# Patient Record
Sex: Female | Born: 1980 | Race: White | Hispanic: No | Marital: Single | Smoking: Former smoker
Health system: Southern US, Community
[De-identification: ages and names within clinical notes are randomized; demographics above are authoritative.]

## PROBLEM LIST (undated history)

## (undated) DIAGNOSIS — G8929 Other chronic pain: Secondary | ICD-10-CM

## (undated) DIAGNOSIS — K529 Noninfective gastroenteritis and colitis, unspecified: Secondary | ICD-10-CM

## (undated) DIAGNOSIS — K2970 Gastritis, unspecified, without bleeding: Secondary | ICD-10-CM

## (undated) DIAGNOSIS — F419 Anxiety disorder, unspecified: Secondary | ICD-10-CM

## (undated) DIAGNOSIS — K449 Diaphragmatic hernia without obstruction or gangrene: Secondary | ICD-10-CM

## (undated) DIAGNOSIS — J449 Chronic obstructive pulmonary disease, unspecified: Secondary | ICD-10-CM

## (undated) HISTORY — PX: CYSTECTOMY: SUR359

## (undated) HISTORY — DX: Other chronic pain: G89.29

## (undated) HISTORY — DX: Anxiety disorder, unspecified: F41.9

---

## 2020-08-27 NOTE — L&D Delivery Note (Signed)
Operative Delivery Note At 4:49 AM a viable female was delivered via Vaginal, [employer], operational).  Presentation: vertex; Position: Right,, Occiput,, Anterior; Station: 0.   FHT noted to be 90bpm.  Pt repositioned, FSE attempted; however not tracing due to cable issues.  FHT re-established with external monitoring.    Verbal consent: obtained from patient.  Risks and benefits discussed in detail.  Risks include, but are not limited to the risks of anesthesia, bleeding, infection, damage to maternal tissues, fetal cephalhematoma.  There is also the risk of inability to effect vaginal delivery of the head, or shoulder dystocia that cannot be resolved by established maneuvers, leading to the need for emergency cesarean section.  Bell applied, one pull with considerable descent followed by delivery.  Cord clamped, cut and handed off to awaiting pediatricians.  2nd degree laceration repaired as outlined below by  Wynelle Bourgeois, CNM.  APGAR: 9, 9; weight: pending   Placenta status: to L&D Cord:  with the following complications: .  Cord pH: n/a  Anesthesia:  none Instruments: Bell Episiotomy: None Lacerations: Perineal;2nd degree Suture Repair: 3-0 vicryl Est. Blood Loss (mL):  300cc  Mom to postpartum.  Baby to Couplet care / Skin to Skin.  Alessandra Bevels Akira Perusse 07/15/2021, 5:10 AM

## 2020-09-10 ENCOUNTER — Emergency Department (HOSPITAL_COMMUNITY): Payer: 59

## 2020-09-10 ENCOUNTER — Other Ambulatory Visit: Payer: Self-pay

## 2020-09-10 ENCOUNTER — Emergency Department (HOSPITAL_COMMUNITY)
Admission: EM | Admit: 2020-09-10 | Discharge: 2020-09-10 | Disposition: A | Discharge status: Home / Self Care | Payer: 59 | Attending: Emergency Medicine | Admitting: Emergency Medicine

## 2020-09-10 DIAGNOSIS — O200 Threatened abortion: Secondary | ICD-10-CM | POA: Insufficient documentation

## 2020-09-10 DIAGNOSIS — O26891 Other specified pregnancy related conditions, first trimester: Secondary | ICD-10-CM | POA: Diagnosis present

## 2020-09-10 DIAGNOSIS — Z3A01 Less than 8 weeks gestation of pregnancy: Secondary | ICD-10-CM | POA: Insufficient documentation

## 2020-09-10 DIAGNOSIS — R58 Hemorrhage, not elsewhere classified: Secondary | ICD-10-CM

## 2020-09-10 DIAGNOSIS — Z87891 Personal history of nicotine dependence: Secondary | ICD-10-CM | POA: Insufficient documentation

## 2020-09-10 DIAGNOSIS — J449 Chronic obstructive pulmonary disease, unspecified: Secondary | ICD-10-CM | POA: Insufficient documentation

## 2020-09-10 HISTORY — DX: Diaphragmatic hernia without obstruction or gangrene: K44.9

## 2020-09-10 HISTORY — DX: Chronic obstructive pulmonary disease, unspecified: J44.9

## 2020-09-10 HISTORY — DX: Noninfective gastroenteritis and colitis, unspecified: K52.9

## 2020-09-10 HISTORY — DX: Gastritis, unspecified, without bleeding: K29.70

## 2020-09-10 LAB — CBC WITH DIFFERENTIAL/PLATELET
Abs Immature Granulocytes: 0.02 10*3/uL (ref 0.00–0.07)
Basophils Absolute: 0.1 10*3/uL (ref 0.0–0.1)
Basophils Relative: 1 %
Eosinophils Absolute: 0.1 10*3/uL (ref 0.0–0.5)
Eosinophils Relative: 1 %
HCT: 42.3 % (ref 36.0–46.0)
Hemoglobin: 14.3 g/dL (ref 12.0–15.0)
Immature Granulocytes: 0 %
Lymphocytes Relative: 29 %
Lymphs Abs: 2.5 10*3/uL (ref 0.7–4.0)
MCH: 30.4 pg (ref 26.0–34.0)
MCHC: 33.8 g/dL (ref 30.0–36.0)
MCV: 90 fL (ref 80.0–100.0)
Monocytes Absolute: 0.8 10*3/uL (ref 0.1–1.0)
Monocytes Relative: 10 %
Neutro Abs: 4.9 10*3/uL (ref 1.7–7.7)
Neutrophils Relative %: 59 %
Platelets: 383 10*3/uL (ref 150–400)
RBC: 4.7 MIL/uL (ref 3.87–5.11)
RDW: 12.1 % (ref 11.5–15.5)
WBC: 8.4 10*3/uL (ref 4.0–10.5)
nRBC: 0 % (ref 0.0–0.2)

## 2020-09-10 LAB — COMPREHENSIVE METABOLIC PANEL
ALT: 20 U/L (ref 0–44)
AST: 17 U/L (ref 15–41)
Albumin: 4.4 g/dL (ref 3.5–5.0)
Alkaline Phosphatase: 43 U/L (ref 38–126)
Anion gap: 8 (ref 5–15)
BUN: 7 mg/dL (ref 6–20)
CO2: 23 mmol/L (ref 22–32)
Calcium: 9.2 mg/dL (ref 8.9–10.3)
Chloride: 104 mmol/L (ref 98–111)
Creatinine, Ser: 0.62 mg/dL (ref 0.44–1.00)
GFR, Estimated: >60 mL/min (ref >60)
Glucose, Bld: 103 mg/dL — ABNORMAL HIGH (ref 70–99)
Potassium: 3.4 mmol/L — ABNORMAL LOW (ref 3.5–5.1)
Sodium: 135 mmol/L (ref 135–145)
Total Bilirubin: 0.4 mg/dL (ref 0.3–1.2)
Total Protein: 8.1 g/dL (ref 6.5–8.1)

## 2020-09-10 LAB — PREGNANCY, URINE: Preg Test, Ur: POSITIVE — AB

## 2020-09-10 LAB — HCG, QUANTITATIVE, PREGNANCY: hCG, Beta Chain, Quant, S: 202 m[IU]/mL — ABNORMAL HIGH (ref <5)

## 2020-09-10 LAB — WET PREP, GENITAL
Clue Cells Wet Prep HPF POC: NONE SEEN
Sperm: NONE SEEN
Trich, Wet Prep: NONE SEEN
Yeast Wet Prep HPF POC: NONE SEEN

## 2020-09-10 LAB — ABO/RH: ABO/RH(D): O POS

## 2020-09-10 NOTE — Discharge Instructions (Signed)
You were seen in the emergency department today with vaginal bleeding in early pregnancy.  Your pregnancy hormone levels are very low and we were not able to see a pregnancy in your uterus.  This could be because you are very early in pregnancy but could also be from an ongoing miscarriage.  We would like for you to return to the emergency department in 2 days (Monday) for repeat pregnancy hormone levels.

## 2020-09-10 NOTE — ED Provider Notes (Signed)
Emergency Department Provider Note   I have reviewed the triage vital signs and the nursing notes.   HISTORY  Chief Complaint No chief complaint on file.   HPI Darlene Stanley is a 40 y.o. female [phone] currently [redacted] wks pregnant by dates with LMP of 07/29/21 presents to the ED with cramping lower abdominal pain and vaginal bleeding. Bleeding began as spotting and then has progressed to heavier bleeding and passage of clot. Patient denies any lightheadedness or fatigue. No dysuria, hesitancy, or urgency. No fever. No severe back pain. She has not seen an OB yet during this pregnancy. No radiation of symptoms or modifying factors.   Past Medical History:  Diagnosis Date  . Colitis   . COPD (chronic obstructive pulmonary disease) (HCC)   . Gastritis   . Hiatal hernia     There are no problems to display for this patient.   Past Surgical History:  Procedure Laterality Date  . CYSTECTOMY     back    Allergies Patient has no allergy information on record.  History reviewed. No pertinent family history.  Social History Social History   Tobacco Use  . Smoking status: Former Smoker    Quit date: 09/10/2017    Years since quitting: 3.0  . Smokeless tobacco: Never Used  Vaping Use  . Vaping Use: Former  . Quit date: 08/22/2020  . Substances: Nicotine, Flavoring  Substance Use Topics  . Alcohol use: Not Currently  . Drug use: Not Currently    Review of Systems  Constitutional: No fever/chills Eyes: No visual changes. ENT: No sore throat. Cardiovascular: Denies chest pain. Respiratory: Denies shortness of breath. Gastrointestinal: Positive lower abdominal pain.  No nausea, no vomiting.  No diarrhea.  No constipation. Genitourinary: Negative for dysuria. Positive vaginal bleeding.  Musculoskeletal: Negative for back pain. Skin: Negative for rash. Neurological: Negative for headaches, focal weakness or numbness.  10-point ROS otherwise  negative.  ____________________________________________   PHYSICAL EXAM:  VITAL SIGNS: ED Triage Vitals  Enc Vitals Group     BP 09/10/20 1616 (!) 122/98     Pulse Rate 09/10/20 1616 90     Resp 09/10/20 1616 16     Temp 09/10/20 1616 98.1 F (36.7 C)     Temp Source 09/10/20 1616 Oral     SpO2 09/10/20 1616 99 %     Weight 09/10/20 1617 204 lb (92.5 kg)     Height 09/10/20 1617 5\' 5"  (1.651 m)   Constitutional: Alert and oriented. Well appearing and in no acute distress. Eyes: Conjunctivae are normal.  Head: Atraumatic. Nose: No congestion/rhinnorhea. Mouth/Throat: Mucous membranes are moist.   Neck: No stridor.   Cardiovascular: Normal rate, regular rhythm. Good peripheral circulation. Grossly normal heart sounds.   Respiratory: Normal respiratory effort.  No retractions. Lungs CTAB. Gastrointestinal: Soft and nontender. No distention.  Genitourinary: Exam performed after obtaining patient's verbal consent and with nurse chaperone. There is clot in the vaginal vault with red blood but no brisk bleeding. Musculoskeletal: No lower extremity tenderness nor edema. No gross deformities of extremities. Neurologic:  Normal speech and language. No gross focal neurologic deficits are appreciated.  Skin:  Skin is warm, dry and intact. No rash noted.   ____________________________________________   LABS (all labs ordered are listed, but only abnormal results are displayed)  Labs Reviewed  WET PREP, GENITAL - Abnormal; Notable for the following components:      Result Value   WBC, Wet Prep HPF POC FEW (*)  All other components within normal limits  COMPREHENSIVE METABOLIC PANEL - Abnormal; Notable for the following components:   Potassium 3.4 (*)    Glucose, Bld 103 (*)    All other components within normal limits  HCG, QUANTITATIVE, PREGNANCY - Abnormal; Notable for the following components:   hCG, Beta Chain, Quant, S 202 (*)    All other components within normal limits   PREGNANCY, URINE - Abnormal; Notable for the following components:   Preg Test, Ur POSITIVE (*)    All other components within normal limits  CBC WITH DIFFERENTIAL/PLATELET  ABO/RH  GC/CHLAMYDIA PROBE AMP ([place]) NOT AT [facility]  WET PREP  (BD AFFIRM) ([place])   ____________________________________________  RADIOLOGY  US OB LESS THAN 14 WEEKS WITH OB TRANSVAGINAL  Result Date: 09/10/2020 CLINICAL DATA:  Heavy bleeding, last menstrual period 07/29/2020, gestational age by last menstrual period of 6 weeks and 1 day with estimated due date of 05/05/2021. Quantitative beta hCG pending. EXAM: OBSTETRIC <14 WK [place] AND TRANSVAGINAL OB US TECHNIQUE: Both transabdominal and transvaginal ultrasound examinations were performed for complete evaluation of the gestation as well as the maternal uterus, adnexal regions, and pelvic cul-de-sac. Transvaginal technique was performed to assess early pregnancy. COMPARISON:  None. FINDINGS: Intrauterine gestational sac: None Yolk sac:  Not Visualized. Embryo:  Not Visualized. Cardiac Activity: Not Visualized. Subchorionic hemorrhage:  None visualized. Maternal uterus/adnexae: Bilateral ovaries are not visualized. The uterus is grossly unremarkable. IMPRESSION: 1. No gestational sac, yolk sac, fetal pole, or cardiac activity visualized. An early pregnancy or ectopic pregnancy cannot be excluded. Recommend follow-up quantitative B-HCG levels and follow-up US in 14 days to assess viability. This recommendation follows SRU consensus guidelines: Diagnostic Criteria for Nonviable Pregnancy Early in the First Trimester. Malva Limes Med 2013; 401:0272-53. 2. Bilateral ovaries are not visualized. Electronically Signed   By: Tish Frederickson M.D.   On: 09/10/2020 17:52    ____________________________________________   PROCEDURES  Procedure(s) performed:   Procedures  None  ____________________________________________   INITIAL IMPRESSION / ASSESSMENT AND PLAN / ED  COURSE  Pertinent labs & imaging results that were available during my care of the patient were reviewed by me and considered in my medical decision making (see chart for details).   Patient presents to the ED with lower abdominal pain and vaginal bleeding currently [redacted] wks pregnant by date. HDS here. Plan for labs, quant hCG, and TVUS to evaluate for IUP or other secondary sign to suspect ectopic vs threatened abortion.   Patient's beta hCG is only 202.  She is O+ and so not requiring RhoGAM.  Ultrasound unable to identify IUP.  No free fluid in the abdomen.  Suspect miscarriage clinically but after speaking with Dr. Macon Large with OB/Gyn she will need a repeat beta hCG on Monday.  Unfortunately, due to weather the clinic will be closed on Monday and so patient will need to return to the emergency department to have this test drawn unless she can find an OB or PCP to do this for her on Monday.  Patient understands that this clinically seems like a miscarriage but that ectopic pregnancy will need to be fully eliminated as a possibility with second blood test.  ____________________________________________  FINAL CLINICAL IMPRESSION(S) / ED DIAGNOSES  Final diagnoses:  Threatened miscarriage in early pregnancy    Note:  This document was prepared using Dragon voice recognition software and may include unintentional dictation errors.  Alona Bene, MD, [facility] Emergency Medicine    Gerren Hoffmeier, Arlyss Repress,  MD 09/11/20 1531

## 2020-09-10 NOTE — ED Triage Notes (Signed)
Pt to the ED with c/o vaginal bleeding during pregnancy. Pt states she is [redacted] wks pregnant and started bleeding today around noon.  Pt states she is soaking a pad an hour and it is bright red in color. The pt has not had her first OB appt yet.

## 2020-09-10 NOTE — ED Notes (Signed)
U/s at bedside

## 2020-09-12 LAB — GC/CHLAMYDIA PROBE AMP (~~LOC~~) NOT AT ARMC
Chlamydia: NEGATIVE
Comment: NEGATIVE
Comment: NORMAL
Neisseria Gonorrhea: NEGATIVE

## 2020-09-19 ENCOUNTER — Other Ambulatory Visit: Payer: Self-pay | Admitting: Obstetrics & Gynecology

## 2020-09-19 DIAGNOSIS — O3680X0 Pregnancy with inconclusive fetal viability, not applicable or unspecified: Secondary | ICD-10-CM

## 2020-09-20 ENCOUNTER — Other Ambulatory Visit: Payer: 59

## 2020-12-02 ENCOUNTER — Ambulatory Visit: Payer: 59 | Admitting: Advanced Practice Midwife

## 2020-12-02 ENCOUNTER — Other Ambulatory Visit: Payer: Self-pay

## 2020-12-02 ENCOUNTER — Encounter: Payer: Self-pay | Admitting: Advanced Practice Midwife

## 2020-12-02 VITALS — BP 125/88 | HR 91 | Ht 65.0 in | Wt 238.5 lb

## 2020-12-02 DIAGNOSIS — O09521 Supervision of elderly multigravida, first trimester: Secondary | ICD-10-CM | POA: Diagnosis not present

## 2020-12-02 DIAGNOSIS — Z3201 Encounter for pregnancy test, result positive: Secondary | ICD-10-CM | POA: Diagnosis not present

## 2020-12-02 DIAGNOSIS — O09529 Supervision of elderly multigravida, unspecified trimester: Secondary | ICD-10-CM | POA: Insufficient documentation

## 2020-12-02 DIAGNOSIS — O3680X0 Pregnancy with inconclusive fetal viability, not applicable or unspecified: Secondary | ICD-10-CM | POA: Diagnosis not present

## 2020-12-02 DIAGNOSIS — O09299 Supervision of pregnancy with other poor reproductive or obstetric history, unspecified trimester: Secondary | ICD-10-CM | POA: Diagnosis not present

## 2020-12-02 LAB — POCT URINE PREGNANCY: Preg Test, Ur: POSITIVE — AB

## 2020-12-02 NOTE — Progress Notes (Signed)
   GYN VISIT Patient name: Darlene Stanley MRN [id number]  Date of birth: [birth year] Chief Complaint:   Possible Pregnancy (Has had 2+ UPT @ home)  History of Present Illness:   Darlene Stanley is a 40 y.o. [phone] Caucasian female being seen today for confirmation of pregnancy. Denies pain, nausea or spotting. Has had vag del x 2, most recent 16 years ago. Is taking PNV and Beano for colitis. Sure of LMP with reg cycles.  Depression screen PHQ 2/9 12/02/2020  Decreased Interest 1  Down, Depressed, Hopeless 1  PHQ - 2 Score 2  Altered sleeping 2  Tired, decreased energy 3  Change in appetite 1  Feeling bad or failure about yourself  2  Trouble concentrating 1  Moving slowly or fidgety/restless 0  Suicidal thoughts 0  PHQ-9 Score 11    Patient's last menstrual period was 10/15/2020.  Review of Systems:   Pertinent items are noted in HPI Denies fever/chills, dizziness, headaches, visual disturbances, fatigue, shortness of breath, chest pain, abdominal pain, vomiting, abnormal vaginal discharge/itching/odor/irritation, problems with periods, bowel movements, urination, or intercourse unless otherwise stated above.  Pertinent History Reviewed:  Reviewed past medical,surgical, social, obstetrical and family history.  Reviewed problem list, medications and allergies. Physical Assessment:   Vitals:   12/02/20 1045  BP: 125/88  Pulse: 91  Weight: 238 lb 8 oz (108.2 kg)  Height: 5\' 5"  (1.651 m)  Body mass index is 39.69 kg/m.       Physical Examination:   General appearance: alert, well appearing, and in no distress  Mental status: alert, oriented to person, place, and time  Skin: warm & dry   Cardiovascular: normal heart rate noted  Respiratory: normal respiratory effort, no distress  Abdomen: soft, non-tender   Pelvic: examination not indicated  Extremities: no edema    Results for orders placed or performed in visit on 12/02/20 (from the past 24 hour(s))  POCT urine  pregnancy   Collection Time: 12/02/20 10:55 AM  Result Value Ref Range   Preg Test, Ur Positive (A) Negative    Assessment & Plan:  1) Early preg> will get dating scan in 2-3wks  2) AMA> start bASA 162mg  daily at 12wks; discussed BPPs at 36wks with IOL 39-40wks  Meds: No orders of the defined types were placed in this encounter.   Orders Placed This Encounter  Procedures  . 02/01/21 OB Comp Less 14 Wks  . POCT urine pregnancy    Return in about 2 weeks (around 12/16/2020) for Dating u/s.  [place] CNM 12/02/2020 11:17 AM

## 2020-12-02 NOTE — Patient Instructions (Signed)
https://www.cdc.gov/pregnancy/infections.html">  First Trimester of Pregnancy  The first trimester of pregnancy starts on the first day of your last menstrual period until the end of week 12. This is also called months 1 through 3 of pregnancy. Body changes during your first trimester Your body goes through many changes during pregnancy. The changes usually return to normal after your baby is born. Physical changes  You may gain or lose weight.  Your breasts may grow larger and hurt. The area around your nipples may get darker.  Dark spots or blotches may develop on your face.  You may have changes in your hair. Health changes  You may feel like you might vomit (nauseous), and you may vomit.  You may have heartburn.  You may have headaches.  You may have trouble pooping (constipation).  Your gums may bleed. Other changes  You may get tired easily.  You may pee (urinate) more often.  Your menstrual periods will stop.  You may not feel hungry.  You may want to eat certain kinds of food.  You may have changes in your emotions from day to day.  You may have more dreams. Follow these instructions at home: Medicines  Take over-the-counter and prescription medicines only as told by your doctor. Some medicines are not safe during pregnancy.  Take a prenatal vitamin that contains at least 600 micrograms (mcg) of folic acid. Eating and drinking  Eat healthy meals that include: ? Fresh fruits and vegetables. ? Whole grains. ? Good sources of protein, such as meat, eggs, or tofu. ? Low-fat dairy products.  Avoid raw meat and unpasteurized juice, milk, and cheese.  If you feel like you may vomit, or you vomit: ? Eat 4 or 5 small meals a day instead of 3 large meals. ? Try eating a few soda crackers. ? Drink liquids between meals instead of during meals.  You may need to take these actions to prevent or treat trouble pooping: ? Drink enough fluids to keep your pee  (urine) pale yellow. ? Eat foods that are high in fiber. These include beans, whole grains, and fresh fruits and vegetables. ? Limit foods that are high in fat and sugar. These include fried or sweet foods. Activity  Exercise only as told by your doctor. Most people can do their usual exercise routine during pregnancy.  Stop exercising if you have cramps or pain in your lower belly (abdomen) or low back.  Do not exercise if it is too hot or too humid, or if you are in a place of great height (high altitude).  Avoid heavy lifting.  If you choose to, you may have sex unless your doctor tells you not to. Relieving pain and discomfort  Wear a good support bra if your breasts are sore.  Rest with your legs raised (elevated) if you have leg cramps or low back pain.  If you have bulging veins (varicose veins) in your legs: ? Wear support hose as told by your doctor. ? Raise your feet for 15 minutes, 3-4 times a day. ? Limit salt in your food. Safety  Wear your seat belt at all times when you are in a car.  Talk with your doctor if someone is hurting you or yelling at you.  Talk with your doctor if you are feeling sad or have thoughts of hurting yourself. Lifestyle  Do not use hot tubs, steam rooms, or saunas.  Do not douche. Do not use tampons or scented sanitary pads.  Do not   use herbal medicines, illegal drugs, or medicines that are not approved by your doctor. Do not drink alcohol.  Do not smoke or use any products that contain nicotine or tobacco. If you need help quitting, ask your doctor.  Avoid cat litter boxes and soil that is used by cats. These carry germs that can cause harm to the baby and can cause a loss of your baby by miscarriage or stillbirth. General instructions  Keep all follow-up visits. This is important.  Ask for help if you need counseling or if you need help with nutrition. Your doctor can give you advice or tell you where to go for help.  Visit your  dentist. At home, brush your teeth with a soft toothbrush. Floss gently.  Write down your questions. Take them to your prenatal visits. Where to find more information  American Pregnancy Association: americanpregnancy.org  American College of Obstetricians and Gynecologists: www.acog.org  Office on Women's Health: womenshealth.gov/pregnancy Contact a doctor if:  You are dizzy.  You have a fever.  You have mild cramps or pressure in your lower belly.  You have a nagging pain in your belly area.  You continue to feel like you may vomit, you vomit, or you have watery poop (diarrhea) for 24 hours or longer.  You have a bad-smelling fluid coming from your vagina.  You have pain when you pee.  You are exposed to a disease that spreads from person to person, such as chickenpox, measles, Zika virus, HIV, or hepatitis. Get help right away if:  You have spotting or bleeding from your vagina.  You have very bad belly cramping or pain.  You have shortness of breath or chest pain.  You have any kind of injury, such as from a fall or a car crash.  You have new or increased pain, swelling, or redness in an arm or leg. Summary  The first trimester of pregnancy starts on the first day of your last menstrual period until the end of week 12 (months 1 through 3).  Eat 4 or 5 small meals a day instead of 3 large meals.  Do not smoke or use any products that contain nicotine or tobacco. If you need help quitting, ask your doctor.  Keep all follow-up visits. This information is not intended to replace advice given to you by your health care provider. Make sure you discuss any questions you have with your health care provider. Document Revised: 01/20/2020 Document Reviewed: 11/26/2019 Elsevier Patient Education  2021 [employer].  

## 2020-12-12 ENCOUNTER — Other Ambulatory Visit: Payer: 59

## 2020-12-24 ENCOUNTER — Other Ambulatory Visit: Payer: Self-pay

## 2020-12-24 ENCOUNTER — Emergency Department (HOSPITAL_COMMUNITY)
Admission: EM | Admit: 2020-12-24 | Discharge: 2020-12-24 | Disposition: A | Discharge status: Home / Self Care | Payer: Medicaid Other | Attending: Emergency Medicine | Admitting: Emergency Medicine

## 2020-12-24 ENCOUNTER — Encounter (HOSPITAL_COMMUNITY): Payer: Self-pay

## 2020-12-24 ENCOUNTER — Emergency Department (HOSPITAL_COMMUNITY): Payer: Medicaid Other

## 2020-12-24 DIAGNOSIS — O26891 Other specified pregnancy related conditions, first trimester: Secondary | ICD-10-CM | POA: Diagnosis present

## 2020-12-24 DIAGNOSIS — Z3A10 10 weeks gestation of pregnancy: Secondary | ICD-10-CM | POA: Diagnosis not present

## 2020-12-24 DIAGNOSIS — Z87891 Personal history of nicotine dependence: Secondary | ICD-10-CM | POA: Insufficient documentation

## 2020-12-24 DIAGNOSIS — O468X1 Other antepartum hemorrhage, first trimester: Secondary | ICD-10-CM | POA: Diagnosis not present

## 2020-12-24 DIAGNOSIS — R102 Pelvic and perineal pain: Secondary | ICD-10-CM | POA: Insufficient documentation

## 2020-12-24 DIAGNOSIS — O4690 Antepartum hemorrhage, unspecified, unspecified trimester: Secondary | ICD-10-CM

## 2020-12-24 DIAGNOSIS — O4691 Antepartum hemorrhage, unspecified, first trimester: Secondary | ICD-10-CM | POA: Diagnosis not present

## 2020-12-24 DIAGNOSIS — O418X10 Other specified disorders of amniotic fluid and membranes, first trimester, not applicable or unspecified: Secondary | ICD-10-CM

## 2020-12-24 LAB — CBC WITH DIFFERENTIAL/PLATELET
Abs Immature Granulocytes: 0.03 10*3/uL (ref 0.00–0.07)
Basophils Absolute: 0 10*3/uL (ref 0.0–0.1)
Basophils Relative: 0 %
Eosinophils Absolute: 0.1 10*3/uL (ref 0.0–0.5)
Eosinophils Relative: 2 %
HCT: 41.6 % (ref 36.0–46.0)
Hemoglobin: 14.3 g/dL (ref 12.0–15.0)
Immature Granulocytes: 0 %
Lymphocytes Relative: 22 %
Lymphs Abs: 1.9 10*3/uL (ref 0.7–4.0)
MCH: 29.7 pg (ref 26.0–34.0)
MCHC: 34.4 g/dL (ref 30.0–36.0)
MCV: 86.5 fL (ref 80.0–100.0)
Monocytes Absolute: 0.8 10*3/uL (ref 0.1–1.0)
Monocytes Relative: 9 %
Neutro Abs: 6 10*3/uL (ref 1.7–7.7)
Neutrophils Relative %: 67 %
Platelets: 283 10*3/uL (ref 150–400)
RBC: 4.81 MIL/uL (ref 3.87–5.11)
RDW: 13.2 % (ref 11.5–15.5)
WBC: 8.9 10*3/uL (ref 4.0–10.5)
nRBC: 0 % (ref 0.0–0.2)

## 2020-12-24 LAB — HCG, QUANTITATIVE, PREGNANCY: hCG, Beta Chain, Quant, S: 29644 m[IU]/mL — ABNORMAL HIGH (ref <5)

## 2020-12-24 NOTE — Discharge Instructions (Addendum)
Follow-up with your OB, call to schedule an appointment.

## 2020-12-24 NOTE — ED Triage Notes (Signed)
Pt to er, pt states that she is [redacted] weeks pregnant, states that she started having some spotting last night, states that it is very light, only when she wipes, states that she called her ob and was told to come in for an ultrasound.
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
## 2020-12-26 ENCOUNTER — Telehealth: Payer: Self-pay

## 2020-12-26 NOTE — Telephone Encounter (Signed)
Pt went to [facility] Saturday with spotting. Pt is [redacted] weeks pregnant. Pt is having off and on spotting now. Pt has a subchorionic hematoma according to [place] at hospital. Pt was wanting the labs that are checked early on baby. She is not scheduled until 01/24/21. Please advise. Thanks!! JSY

## 2020-12-26 NOTE — Telephone Encounter (Signed)
Pt called stating that she was seen at the Emergency room for some spotting and wanted to know if she needs to be seen for a f/u

## 2020-12-26 NOTE — Telephone Encounter (Signed)
Pt aware of Kim's recommendations. Pt voiced understanding. Chart sent to [facility] for review. JSY

## 2020-12-27 ENCOUNTER — Telehealth: Payer: Self-pay | Admitting: *Deleted

## 2020-12-27 NOTE — Telephone Encounter (Signed)
Patient wants a returned phone from a nurse and also wants to know about an OB appt being scheduled. Patient had an ultrasound on 12/24/20 at [facility]. Could you review and please advise.

## 2020-12-29 ENCOUNTER — Encounter: Payer: Self-pay | Admitting: *Deleted

## 2020-12-29 ENCOUNTER — Other Ambulatory Visit: Payer: Self-pay | Admitting: *Deleted

## 2021-01-02 ENCOUNTER — Other Ambulatory Visit: Payer: 59

## 2021-01-17 DIAGNOSIS — O0990 Supervision of high risk pregnancy, unspecified, unspecified trimester: Secondary | ICD-10-CM | POA: Insufficient documentation

## 2021-01-18 ENCOUNTER — Encounter: Payer: Self-pay | Admitting: Women's Health

## 2021-01-18 ENCOUNTER — Ambulatory Visit (INDEPENDENT_AMBULATORY_CARE_PROVIDER_SITE_OTHER): Payer: 59 | Admitting: Women's Health

## 2021-01-18 ENCOUNTER — Ambulatory Visit: Payer: 59 | Admitting: *Deleted

## 2021-01-18 ENCOUNTER — Other Ambulatory Visit: Payer: Self-pay

## 2021-01-18 VITALS — BP 146/90 | HR 79 | Wt 230.0 lb

## 2021-01-18 DIAGNOSIS — F319 Bipolar disorder, unspecified: Secondary | ICD-10-CM

## 2021-01-18 DIAGNOSIS — Z3A14 14 weeks gestation of pregnancy: Secondary | ICD-10-CM

## 2021-01-18 DIAGNOSIS — O09522 Supervision of elderly multigravida, second trimester: Secondary | ICD-10-CM

## 2021-01-18 DIAGNOSIS — R030 Elevated blood-pressure reading, without diagnosis of hypertension: Secondary | ICD-10-CM

## 2021-01-18 DIAGNOSIS — O99342 Other mental disorders complicating pregnancy, second trimester: Secondary | ICD-10-CM

## 2021-01-18 DIAGNOSIS — O0992 Supervision of high risk pregnancy, unspecified, second trimester: Secondary | ICD-10-CM

## 2021-01-18 DIAGNOSIS — Z8759 Personal history of other complications of pregnancy, childbirth and the puerperium: Secondary | ICD-10-CM

## 2021-01-18 DIAGNOSIS — Z124 Encounter for screening for malignant neoplasm of cervix: Secondary | ICD-10-CM

## 2021-01-18 DIAGNOSIS — Z363 Encounter for antenatal screening for malformations: Secondary | ICD-10-CM

## 2021-01-18 LAB — POCT URINALYSIS DIPSTICK OB
Blood, UA: NEGATIVE
Glucose, UA: NEGATIVE
Leukocytes, UA: NEGATIVE
Nitrite, UA: NEGATIVE
POC,PROTEIN,UA: NEGATIVE

## 2021-01-18 LAB — OB RESULTS CONSOLE GC/CHLAMYDIA: Gonorrhea: NEGATIVE

## 2021-01-18 MED ORDER — ASPIRIN 81 MG PO TBEC: 162.0000 mg | DELAYED_RELEASE_TABLET | Freq: Every day | ORAL | 2 refills | Status: DC

## 2021-01-18 NOTE — Progress Notes (Signed)
INITIAL OBSTETRICAL VISIT Patient name: Jazma Pickel MRN [id number]  Date of birth: 10/05/1980 Chief Complaint:   Initial Prenatal Visit (nausea)  History of Present Illness:   Sophonie Goforth is a 40 y.o. [id number] Caucasian female at [redacted]w[redacted]d by [place] at 11 weeks with an Estimated Date of Delivery: 07/14/21 being seen today for her initial obstetrical visit.   Her obstetrical history is significant for SAB x 2; term SVB x 2- 1st w/ PPH.   Today she reports some nausea, no vomiting- declines meds.  Elevated bp today, normal at preg confirmation visit, states she feels anxious, no h/o HTN H/O COPD dx 73yrs ago, former smoker- quit 46yrs ago. Has maintenance and rescue inhaler Bipolar- no meds x 75yrs (wellbutrin last med), overall doing well off meds, just has some anxiety- ok w/ [facility] referral. Declines meds.  Depression screen [facility] 2/9 01/18/2021 12/02/2020  Decreased Interest 3 1  Down, Depressed, Hopeless 2 1  PHQ - 2 Score 5 2  Altered sleeping 2 2  Tired, decreased energy 3 3  Change in appetite 2 1  Feeling bad or failure about yourself  2 2  Trouble concentrating 3 1  Moving slowly or fidgety/restless 1 0  Suicidal thoughts 0 0  PHQ-9 Score 18 11    Patient's last menstrual period was 10/15/2020. Last pap 2021 in [place]. Results were: neg per pt, will get results Review of Systems:   Pertinent items are noted in HPI Denies cramping/contractions, leakage of fluid, vaginal bleeding, abnormal vaginal discharge w/ itching/odor/irritation, headaches, visual changes, shortness of breath, chest pain, abdominal pain, severe nausea/vomiting, or problems with urination or bowel movements unless otherwise stated above.  Pertinent History Reviewed:  Reviewed past medical,surgical, social, obstetrical and family history.  Reviewed problem list, medications and allergies. OB History  Gravida Para Term Preterm AB Living  5 2 2   2 2   SAB IAB Ectopic Multiple Live Births  2       2    # Outcome Date GA  Lbr Len/2nd Weight Sex Delivery Anes PTL Lv  5 Current           4 Term 06/02/04 [redacted]w[redacted]d  9 lb 9 oz (4.338 kg) M Vag-Spont EPI N LIV  3 Term 12/01/98 [redacted]w[redacted]d  7 lb 11 oz (3.487 kg) M Vag-Vacuum  Y LIV     Birth Comments: "fluid low"  2 SAB           1 SAB            Physical Assessment:   Vitals:   01/18/21 1118  BP: (!) 146/90  Pulse: 79  Weight: 230 lb (104.3 kg)  Body mass index is 38.27 kg/m.       Physical Examination:  General appearance - well appearing, and in no distress  Mental status - alert, oriented to person, place, and time  Psych:  She has a normal mood and affect  Skin - warm and dry, normal color, no suspicious lesions noted  Chest - effort normal, all lung fields clear to auscultation bilaterally  Heart - normal rate and regular rhythm  Abdomen - soft, nontender  Extremities:  No swelling or varicosities noted  Pelvic - VULVA: normal appearing vulva with no masses, tenderness or lesions  VAGINA: normal appearing vagina with normal color and discharge, no lesions  CERVIX: normal appearing cervix without discharge or lesions, no CMT  Thin prep pap is not done  Chaperone: N/A    TODAY'S FHR: 150  via doppler  Results for orders placed or performed in visit on 01/18/21 (from the past 24 hour(s))  POC Urinalysis Dipstick OB   Collection Time: 01/18/21 11:42 AM  Result Value Ref Range   Color, UA     Clarity, UA     Glucose, UA Negative Negative   Bilirubin, UA     Ketones, UA large    Spec Grav, UA     Blood, UA neg    pH, UA     POC,PROTEIN,UA Negative Negative, Trace, Small (1+), Moderate (2+), Large (3+), 4+   Urobilinogen, UA     Nitrite, UA neg    Leukocytes, UA Negative Negative   Appearance     Odor      Assessment & Plan:  1) High-Risk Pregnancy [id number] at [redacted]w[redacted]d with an Estimated Date of Delivery: 07/14/21   2) Initial OB visit  3) AMA> 39yo, start ASA  4) Elevated bp> no h/o HTN, will get baseline labs, ASA 162mg   5) Bipolar> no meds x  81yrs, overall doing well, some anxiety, declines meds. [facility] referral ordered  6) H/O PPH   7) H/O COPD> quit smoking 31yrs ago, has inhalers  Meds:  Meds ordered this encounter  Medications  . aspirin 81 MG EC tablet    Sig: Take 2 tablets (162 mg total) by mouth daily. Swallow whole.    Dispense:  180 tablet    Refill:  2    Order Specific Question:   Supervising Provider    Answer:   9yr H [[id number]]    Initial labs obtained Continue prenatal vitamins Reviewed n/v relief measures and warning s/s to report Reviewed recommended weight gain based on pre-gravid BMI Encouraged well-balanced diet Genetic & carrier screening discussed: requests Panorama, AFP and Horizon 14 , too late for NT/IT Ultrasound discussed; fetal survey: requested CCNC completed> form faxed if has or is planning to apply for medicaid The nature of [facility] with multiple MDs and other Advanced Practice Providers was explained to patient; also emphasized that fellows, residents, and students are part of our team. Does not have home bp cuff. Office bp cuff given: no. Plans to buy one. Check bp weekly, let [employer] know if consistently >140/90.   Indications for early A1C (per uptodate) BMI >=25 (>=23 in Asian women) AND one of the following >= 40yo Yes  Follow-up: Return in about 4 weeks (around 02/15/2021) for HROB, 02/17/2021, AFP (labs), MD or CNM, in person; please get [facility] result from [facility].   Orders Placed This Encounter  Procedures  . Urine Culture  . GC/Chlamydia Probe Amp  . [facility] OB Comp + 14 Wk  . Genetic Screening  . Pain Management Screening Profile (10S)  . CBC/D/Plt+RPR+Rh+ABO+RubIgG...  . Comprehensive metabolic panel  . Protein / creatinine ratio, urine  . Amb ref to [place]  . POC Urinalysis Dipstick OB    [employer] CNM, [facility] 01/18/2021 1:05 PM

## 2021-01-18 NOTE — Patient Instructions (Signed)
Darlene Stanley, I greatly value your feedback.  If you receive a survey following your visit with [place] today, we appreciate you taking the time to fill it out.  Thanks, Joellyn Haff, CNM, WHNP-BC  [facility] ([address] [place], [place] [address]) Entrance C, located off of [address] valet parking  Go to [email or web address] to register for FREE online childbirth classes  [place] Pediatricians/Family Doctors:  [facility] [phone]            [facility] [phone]                 [facility] [phone] (usually not accepting new patients unless you have family there already, you are always welcome to call and ask)       [facility] Department [phone]       [facility] Pediatricians/Family Doctors:   [facility]: [phone]  [facility]: [phone]  [facility]: [phone]  [facility] Doctors:   [facility]: [phone]   [facility]: [phone]  [facility] Doctors:  [facility]: [phone]    Home Blood Pressure Monitoring for Patients   Your provider has recommended that you check your blood pressure (BP) at least once a week at home. If you do not have a blood pressure cuff at home, one will be provided for you. Contact your provider if you have not received your monitor within 1 week.   Helpful Tips for Accurate Home Blood Pressure Checks  . Don't smoke, exercise, or drink caffeine 30 minutes before checking your BP . Use the restroom before checking your BP (a full bladder can raise your pressure) . Relax in a comfortable upright chair . Feet on the ground . Left arm resting comfortably on a flat surface at the level of your heart . Legs uncrossed . Back supported . Sit quietly and don't talk . Place the cuff on your bare arm . Adjust snuggly, so  that only two fingertips can fit between your skin and the top of the cuff . Check 2 readings separated by at least one minute . Keep a log of your BP readings . For a visual, please reference this diagram: http://ccnc.care/bpdiagram  Provider Name: [facility]     Phone: [phone]  Zone 1: ALL CLEAR  Continue to monitor your symptoms:  . BP reading is less than 140 (top number) or less than 90 (bottom number)  . No right upper stomach pain . No headaches or seeing spots . No feeling nauseated or throwing up . No swelling in face and hands  Zone 2: CAUTION Call your doctor's office for any of the following:  . BP reading is greater than 140 (top number) or greater than 90 (bottom number)  . Stomach pain under your ribs in the middle or right side . Headaches or seeing spots . Feeling nauseated or throwing up . Swelling in face and hands  Zone 3: EMERGENCY  Seek immediate medical care if you have any of the following:  . BP reading is greater than160 (top number) or greater than 110 (bottom number) . Severe headaches not improving with Tylenol . Serious difficulty catching your breath . Any worsening symptoms from Zone 2     Second Trimester of Pregnancy The second trimester is from week 14 through week 27 (months 4 through 6). The second trimester is often a time when you feel your best.  Your body has adjusted to being pregnant, and you begin to feel better physically. Usually, morning sickness has lessened or quit completely, you may have more energy, and you may have an increase in appetite. The second trimester is also a time when the fetus is growing rapidly. At the end of the sixth month, the fetus is about 9 inches long and weighs about 1 pounds. You will likely begin to feel the baby move (quickening) between 16 and 20 weeks of pregnancy. Body changes during your second trimester Your body continues to go through many changes during your second trimester. The  changes vary from woman to woman.  Your weight will continue to increase. You will notice your lower abdomen bulging out.  You may begin to get stretch marks on your hips, abdomen, and breasts.  You may develop headaches that can be relieved by medicines. The medicines should be approved by your health care provider.  You may urinate more often because the fetus is pressing on your bladder.  You may develop or continue to have heartburn as a result of your pregnancy.  You may develop constipation because certain hormones are causing the muscles that push waste through your intestines to slow down.  You may develop hemorrhoids or swollen, bulging veins (varicose veins).  You may have back pain. This is caused by: ? Weight gain. ? Pregnancy hormones that are relaxing the joints in your pelvis. ? A shift in weight and the muscles that support your balance.  Your breasts will continue to grow and they will continue to become tender.  Your gums may bleed and may be sensitive to brushing and flossing.  Dark spots or blotches (chloasma, mask of pregnancy) may develop on your face. This will likely fade after the baby is born.  A dark line from your belly button to the pubic area (linea nigra) may appear. This will likely fade after the baby is born.  You may have changes in your hair. These can include thickening of your hair, rapid growth, and changes in texture. Some women also have hair loss during or after pregnancy, or hair that feels dry or thin. Your hair will most likely return to normal after your baby is born.  What to expect at prenatal visits During a routine prenatal visit:  You will be weighed to make sure you and the fetus are growing normally.  Your blood pressure will be taken.  Your abdomen will be measured to track your baby's growth.  The fetal heartbeat will be listened to.  Any test results from the previous visit will be discussed.  Your health care  provider may ask you:  How you are feeling.  If you are feeling the baby move.  If you have had any abnormal symptoms, such as leaking fluid, bleeding, severe headaches, or abdominal cramping.  If you are using any tobacco products, including cigarettes, chewing tobacco, and electronic cigarettes.  If you have any questions.  Other tests that may be performed during your second trimester include:  Blood tests that check for: ? Low iron levels (anemia). ? High blood sugar that affects pregnant women (gestational diabetes) between 64 and 28 weeks. ? Rh antibodies. This is to check for a protein on red blood cells (Rh factor).  Urine tests to check for infections, diabetes, or protein in the urine.  An ultrasound to confirm the proper growth and development of the baby.  An amniocentesis to check for possible genetic problems.  Fetal screens  for spina bifida and Down syndrome.  HIV (human immunodeficiency virus) testing. Routine prenatal testing includes screening for HIV, unless you choose not to have this test.  Follow these instructions at home: Medicines  Follow your health care provider's instructions regarding medicine use. Specific medicines may be either safe or unsafe to take during pregnancy.  Take a prenatal vitamin that contains at least 600 micrograms (mcg) of folic acid.  If you develop constipation, try taking a stool softener if your health care provider approves. Eating and drinking  Eat a balanced diet that includes fresh fruits and vegetables, whole grains, good sources of protein such as meat, eggs, or tofu, and low-fat dairy. Your health care provider will help you determine the amount of weight gain that is right for you.  Avoid raw meat and uncooked cheese. These carry germs that can cause birth defects in the baby.  If you have low calcium intake from food, talk to your health care provider about whether you should take a daily calcium  supplement.  Limit foods that are high in fat and processed sugars, such as fried and sweet foods.  To prevent constipation: ? Drink enough fluid to keep your urine clear or pale yellow. ? Eat foods that are high in fiber, such as fresh fruits and vegetables, whole grains, and beans. Activity  Exercise only as directed by your health care provider. Most women can continue their usual exercise routine during pregnancy. Try to exercise for 30 minutes at least 5 days a week. Stop exercising if you experience uterine contractions.  Avoid heavy lifting, wear low heel shoes, and practice good posture.  A sexual relationship may be continued unless your health care provider directs you otherwise. Relieving pain and discomfort  Wear a good support bra to prevent discomfort from breast tenderness.  Take warm sitz baths to soothe any pain or discomfort caused by hemorrhoids. Use hemorrhoid cream if your health care provider approves.  Rest with your legs elevated if you have leg cramps or low back pain.  If you develop varicose veins, wear support hose. Elevate your feet for 15 minutes, 3-4 times a day. Limit salt in your diet. Prenatal Care  Write down your questions. Take them to your prenatal visits.  Keep all your prenatal visits as told by your health care provider. This is important. Safety  Wear your seat belt at all times when driving.  Make a list of emergency phone numbers, including numbers for family, friends, the hospital, and police and fire departments. General instructions  Ask your health care provider for a referral to a local prenatal education class. Begin classes no later than the beginning of month 6 of your pregnancy.  Ask for help if you have counseling or nutritional needs during pregnancy. Your health care provider can offer advice or refer you to specialists for help with various needs.  Do not use hot tubs, steam rooms, or saunas.  Do not douche or use  tampons or scented sanitary pads.  Do not cross your legs for long periods of time.  Avoid cat litter boxes and soil used by cats. These carry germs that can cause birth defects in the baby and possibly loss of the fetus by miscarriage or stillbirth.  Avoid all smoking, herbs, alcohol, and unprescribed drugs. Chemicals in these products can affect the formation and growth of the baby.  Do not use any products that contain nicotine or tobacco, such as cigarettes and e-cigarettes. If you need help quitting,  ask your health care provider.  Visit your dentist if you have not gone yet during your pregnancy. Use a soft toothbrush to brush your teeth and be gentle when you floss. Contact a health care provider if:  You have dizziness.  You have mild pelvic cramps, pelvic pressure, or nagging pain in the abdominal area.  You have persistent nausea, vomiting, or diarrhea.  You have a bad smelling vaginal discharge.  You have pain when you urinate. Get help right away if:  You have a fever.  You are leaking fluid from your vagina.  You have spotting or bleeding from your vagina.  You have severe abdominal cramping or pain.  You have rapid weight gain or weight loss.  You have shortness of breath with chest pain.  You notice sudden or extreme swelling of your face, hands, ankles, feet, or legs.  You have not felt your baby move in over an hour.  You have severe headaches that do not go away when you take medicine.  You have vision changes. Summary  The second trimester is from week 14 through week 27 (months 4 through 6). It is also a time when the fetus is growing rapidly.  Your body goes through many changes during pregnancy. The changes vary from woman to woman.  Avoid all smoking, herbs, alcohol, and unprescribed drugs. These chemicals affect the formation and growth your baby.  Do not use any tobacco products, such as cigarettes, chewing tobacco, and e-cigarettes. If you  need help quitting, ask your health care provider.  Contact your health care provider if you have any questions. Keep all prenatal visits as told by your health care provider. This is important. This information is not intended to replace advice given to you by your health care provider. Make sure you discuss any questions you have with your health care provider. Document Released: 08/07/2001 Document Revised: 01/19/2016 Document Reviewed: 10/14/2012 Elsevier Interactive Patient Education  2017 [employer].

## 2021-01-19 ENCOUNTER — Encounter: Payer: Self-pay | Admitting: Women's Health

## 2021-01-19 DIAGNOSIS — R030 Elevated blood-pressure reading, without diagnosis of hypertension: Secondary | ICD-10-CM | POA: Insufficient documentation

## 2021-01-19 DIAGNOSIS — F1290 Cannabis use, unspecified, uncomplicated: Secondary | ICD-10-CM | POA: Insufficient documentation

## 2021-01-19 DIAGNOSIS — Z2839 Other underimmunization status: Secondary | ICD-10-CM | POA: Insufficient documentation

## 2021-01-19 LAB — COMPREHENSIVE METABOLIC PANEL
ALT: 30 IU/L (ref 0–32)
AST: 20 IU/L (ref 0–40)
Albumin/Globulin Ratio: 1.5 (ref 1.2–2.2)
Albumin: 4.4 g/dL (ref 3.8–4.8)
Alkaline Phosphatase: 61 IU/L (ref 44–121)
BUN/Creatinine Ratio: 13 (ref 9–23)
BUN: 6 mg/dL (ref 6–24)
Bilirubin Total: 0.4 mg/dL (ref 0.0–1.2)
CO2: 18 mmol/L — ABNORMAL LOW (ref 20–29)
Calcium: 9.4 mg/dL (ref 8.7–10.2)
Chloride: 99 mmol/L (ref 96–106)
Creatinine, Ser: 0.48 mg/dL — ABNORMAL LOW (ref 0.57–1.00)
Globulin, Total: 3 g/dL (ref 1.5–4.5)
Glucose: 89 mg/dL (ref 65–99)
Potassium: 3.8 mmol/L (ref 3.5–5.2)
Sodium: 134 mmol/L (ref 134–144)
Total Protein: 7.4 g/dL (ref 6.0–8.5)
eGFR: 122 mL/min/{1.73_m2} (ref >59)

## 2021-01-19 LAB — MED LIST OPTION NOT SELECTED

## 2021-01-19 LAB — PROTEIN / CREATININE RATIO, URINE
Creatinine, Urine: 55.1 mg/dL
Protein, Ur: 5.3 mg/dL
Protein/Creat Ratio: 96 mg/g creat (ref 0–200)

## 2021-01-19 LAB — PMP SCREEN PROFILE (10S), URINE
Amphetamine Scrn, Ur: NEGATIVE ng/mL
BARBITURATE SCREEN URINE: NEGATIVE ng/mL
BENZODIAZEPINE SCREEN, URINE: NEGATIVE ng/mL
CANNABINOIDS UR QL SCN: POSITIVE ng/mL — AB
Cocaine (Metab) Scrn, Ur: NEGATIVE ng/mL
Creatinine(Crt), U: 55.4 mg/dL (ref 20.0–300.0)
Methadone Screen, Urine: NEGATIVE ng/mL
OXYCODONE+OXYMORPHONE UR QL SCN: NEGATIVE ng/mL
Opiate Scrn, Ur: NEGATIVE ng/mL
Ph of Urine: 5.6 (ref 4.5–8.9)
Phencyclidine Qn, Ur: NEGATIVE ng/mL
Propoxyphene Scrn, Ur: NEGATIVE ng/mL

## 2021-01-20 LAB — GC/CHLAMYDIA PROBE AMP
Chlamydia trachomatis, NAA: NEGATIVE
Neisseria Gonorrhoeae by PCR: NEGATIVE

## 2021-01-20 LAB — URINE CULTURE: Organism ID, Bacteria: NO GROWTH

## 2021-01-24 ENCOUNTER — Other Ambulatory Visit: Payer: 59

## 2021-01-24 ENCOUNTER — Ambulatory Visit: Payer: 59 | Admitting: *Deleted

## 2021-01-24 ENCOUNTER — Encounter: Payer: 59 | Admitting: Women's Health

## 2021-01-24 LAB — CBC/D/PLT+RPR+RH+ABO+RUBIGG...
Basophils Absolute: 0.1 10*3/uL (ref 0.0–0.2)
Basos: 1 %
EOS (ABSOLUTE): 0 10*3/uL (ref 0.0–0.4)
Eos: 0 %
HCV Ab: <0.1 s/co ratio (ref 0.0–0.9)
HIV Screen 4th Generation wRfx: NONREACTIVE
Hematocrit: 42 % (ref 34.0–46.6)
Hemoglobin: 14.2 g/dL (ref 11.1–15.9)
Hepatitis B Surface Ag: NEGATIVE
Immature Grans (Abs): 0 10*3/uL (ref 0.0–0.1)
Immature Granulocytes: 0 %
Lymphocytes Absolute: 2.5 10*3/uL (ref 0.7–3.1)
Lymphs: 27 %
MCH: 29.4 pg (ref 26.6–33.0)
MCHC: 33.8 g/dL (ref 31.5–35.7)
MCV: 87 fL (ref 79–97)
Monocytes Absolute: 0.6 10*3/uL (ref 0.1–0.9)
Monocytes: 7 %
Neutrophils Absolute: 5.9 10*3/uL (ref 1.4–7.0)
Neutrophils: 65 %
Platelets: 306 10*3/uL (ref 150–450)
RBC: 4.83 x10E6/uL (ref 3.77–5.28)
RDW: 13.1 % (ref 11.7–15.4)
RPR Ser Ql: NONREACTIVE
Rh Factor: POSITIVE
Rubella Antibodies, IGG: <0.9 index — ABNORMAL LOW (ref >0.99)
WBC: 9.1 10*3/uL (ref 3.4–10.8)

## 2021-01-24 LAB — AB SCR+ANTIBODY ID: Antibody Screen: POSITIVE — AB

## 2021-01-24 LAB — HCV INTERPRETATION

## 2021-01-30 ENCOUNTER — Encounter: Payer: Self-pay | Admitting: Women's Health

## 2021-01-31 LAB — SPECIMEN STATUS REPORT

## 2021-02-01 LAB — SPECIMEN STATUS REPORT

## 2021-02-01 LAB — HGB A1C W/O EAG: Hgb A1c MFr Bld: 5.4 % (ref 4.8–5.6)

## 2021-02-14 ENCOUNTER — Other Ambulatory Visit: Payer: Self-pay | Admitting: Women's Health

## 2021-02-14 DIAGNOSIS — O0992 Supervision of high risk pregnancy, unspecified, second trimester: Secondary | ICD-10-CM

## 2021-02-14 DIAGNOSIS — Z363 Encounter for antenatal screening for malformations: Secondary | ICD-10-CM

## 2021-02-14 DIAGNOSIS — O09522 Supervision of elderly multigravida, second trimester: Secondary | ICD-10-CM

## 2021-02-15 ENCOUNTER — Ambulatory Visit (INDEPENDENT_AMBULATORY_CARE_PROVIDER_SITE_OTHER): Payer: Medicaid Other | Admitting: Obstetrics & Gynecology

## 2021-02-15 ENCOUNTER — Other Ambulatory Visit: Payer: Self-pay

## 2021-02-15 ENCOUNTER — Ambulatory Visit (INDEPENDENT_AMBULATORY_CARE_PROVIDER_SITE_OTHER): Payer: 59

## 2021-02-15 ENCOUNTER — Encounter: Payer: Self-pay | Admitting: Obstetrics & Gynecology

## 2021-02-15 VITALS — BP 115/81 | HR 83 | Wt 235.5 lb

## 2021-02-15 DIAGNOSIS — R899 Unspecified abnormal finding in specimens from other organs, systems and tissues: Secondary | ICD-10-CM

## 2021-02-15 DIAGNOSIS — Z363 Encounter for antenatal screening for malformations: Secondary | ICD-10-CM

## 2021-02-15 DIAGNOSIS — Z3A18 18 weeks gestation of pregnancy: Secondary | ICD-10-CM

## 2021-02-15 DIAGNOSIS — O09522 Supervision of elderly multigravida, second trimester: Secondary | ICD-10-CM

## 2021-02-15 DIAGNOSIS — Z1379 Encounter for other screening for genetic and chromosomal anomalies: Secondary | ICD-10-CM

## 2021-02-15 DIAGNOSIS — J449 Chronic obstructive pulmonary disease, unspecified: Secondary | ICD-10-CM

## 2021-02-15 DIAGNOSIS — O0992 Supervision of high risk pregnancy, unspecified, second trimester: Secondary | ICD-10-CM

## 2021-02-15 LAB — POCT URINALYSIS DIPSTICK OB
Blood, UA: NEGATIVE
Glucose, UA: NEGATIVE
Ketones, UA: NEGATIVE
Leukocytes, UA: NEGATIVE
Nitrite, UA: NEGATIVE
POC,PROTEIN,UA: NEGATIVE

## 2021-02-15 NOTE — Progress Notes (Signed)
[place] 18+5 wks,cephalic,posterior placenta gr 0,normal right ovary,left ovary not visualized,cx 4.7 cm,SVP of fluid 4.5 cm,fhr 138 bpm,EFW 247 g 36%,anatomy complete,no obvious abnormalities

## 2021-02-15 NOTE — Progress Notes (Signed)
HIGH-RISK PREGNANCY VISIT Patient name: Darlene Stanley MRN [id number]  Date of birth: December 04, 1980 Chief Complaint:   High Risk Gestation ([place] today: AFP & antibody screen repeat today)  History of Present Illness:   Darlene Stanley is a 40 y.o. [id number] female at [redacted]w[redacted]d with an Estimated Date of Delivery: 07/14/21 being seen today for ongoing management of a high-risk pregnancy complicated by   -COPD -symptoms several yrs ago when she quit smoking -meds include Symbicort every other day, albuterol prn- used rarely  -AMA []  Antepartum testing @ 36wks  -Bipolar d/o -no meds currently -[facility] referral in process  -h/o PPH   Today she reports  mild RLQ cramping- not requiring medication .   Contractions: Not present. Vag. Bleeding: None.  Movement: Present. denies leaking of fluid.   Depression screen [facility] 2/9 01/18/2021 12/02/2020  Decreased Interest 3 1  Down, Depressed, Hopeless 2 1  PHQ - 2 Score 5 2  Altered sleeping 2 2  Tired, decreased energy 3 3  Change in appetite 2 1  Feeling bad or failure about yourself  2 2  Trouble concentrating 3 1  Moving slowly or fidgety/restless 1 0  Suicidal thoughts 0 0  PHQ-9 Score 18 11     Current Outpatient Medications  Medication Instructions   ALBUTEROL IN Inhalation, As needed   aspirin 162 mg, Oral, Daily, Swallow whole.   budesonide-formoterol (SYMBICORT) 160-4.5 MCG/ACT inhaler 2 puffs, Inhalation, 2 times daily   Cyanocobalamin (VITAMIN B-12 PO) Oral   Prenatal Vit-Fe Fumarate-FA (PRENATAL VITAMIN PO) Oral   UNABLE TO FIND [place]-daily     Review of Systems:   Pertinent items are noted in HPI Denies abnormal vaginal discharge w/ itching/odor/irritation, headaches, visual changes, shortness of breath, chest pain, abdominal pain, severe nausea/vomiting, or problems with urination or bowel movements unless otherwise stated above. Pertinent History Reviewed:  Reviewed past medical,surgical, social, obstetrical and family history.   Reviewed problem list, medications and allergies. Physical Assessment:   Vitals:   02/15/21 1152  BP: 115/81  Pulse: 83  Weight: 235 lb 8 oz (106.8 kg)  Body mass index is 39.19 kg/m.           Physical Examination:   General appearance: alert, well appearing, and in no distress  Mental status: alert, oriented to person, place, and time  Skin: warm & dry   Extremities: Edema: None    Cardiovascular: normal heart rate noted  Respiratory: normal respiratory effort, no distress  Abdomen: gravid, soft, non-tender  Pelvic: Cervical exam deferred         Fetal Status:   Anatomy scan today  Movement: Present    Fetal Surveillance Testing today: cephalic,posterior placenta gr 0,normal right ovary,left ovary not visualized,cx 4.7 cm,SVP of fluid 4.5 cm,fhr 138 bpm,EFW 247 g 36%,anatomy complete,no obvious abnormalities   Chaperone: N/A    Results for orders placed or performed in visit on 02/15/21 (from the past 24 hour(s))  POC Urinalysis Dipstick OB   Collection Time: 02/15/21 11:53 AM  Result Value Ref Range   Color, UA     Clarity, UA     Glucose, UA Negative Negative   Bilirubin, UA     Ketones, UA neg    Spec Grav, UA     Blood, UA neg    pH, UA     POC,PROTEIN,UA Negative Negative, Trace, Small (1+), Moderate (2+), Large (3+), 4+   Urobilinogen, UA     Nitrite, UA neg    Leukocytes, UA Negative Negative  Appearance     Odor       Assessment & Plan:  High-risk pregnancy: [id number] at [redacted]w[redacted]d with an Estimated Date of Delivery: 07/14/21   1) COPD -well controlled with current meds (Symbicort qod)  2) AMA -AFP to be collected, normal anatomy scan  3) h/o bipolar- no meds currently -letter sent to patient regarding call back to [facility]  Meds: No orders of the defined types were placed in this encounter.   Labs/procedures today: anatomy scan, AFP/repeat antibody screen  Treatment Plan:  continue with routine OB care  Reviewed: Preterm labor symptoms and general  obstetric precautions including but not limited to vaginal bleeding, contractions, leaking of fluid and fetal movement were reviewed in detail with the patient.  All questions were answered. Pt has home bp cuff. Check bp weekly, let us know if >140/90.   Follow-up: Return in about 4 weeks (around 03/15/2021) for HROB visit.   Future Appointments  Date Time Provider Department Center  03/15/2021  8:50 AM Arabella Merles, CNM [facility]    Orders Placed This Encounter  Procedures   AFP, Serum, Open Spina Bifida   POC Urinalysis Dipstick OB   Antibody screen    Myna Hidalgo, DO Attending Obstetrician & Gynecologist, [facility] for [employer], [facility]

## 2021-02-16 LAB — ANTIBODY SCREEN: Antibody Screen: NEGATIVE

## 2021-02-17 LAB — AFP, SERUM, OPEN SPINA BIFIDA
AFP MoM: 1.57
AFP Value: 58.4 ng/mL
Gest. Age on Collection Date: 18.5 weeks
Maternal Age At EDD: 42.4 yr
OSBR Risk 1 IN: 2270
Test Results:: NEGATIVE
Weight: 236 [lb_av]

## 2021-02-23 ENCOUNTER — Encounter: Payer: Self-pay | Admitting: Obstetrics & Gynecology

## 2021-03-14 ENCOUNTER — Other Ambulatory Visit: Payer: Self-pay | Admitting: Women's Health

## 2021-03-14 MED ORDER — DOXYLAMINE-PYRIDOXINE 10-10 MG PO TBEC: DELAYED_RELEASE_TABLET | ORAL | 6 refills | Status: DC

## 2021-03-15 ENCOUNTER — Other Ambulatory Visit: Payer: Self-pay

## 2021-03-15 ENCOUNTER — Ambulatory Visit (INDEPENDENT_AMBULATORY_CARE_PROVIDER_SITE_OTHER): Payer: 59 | Admitting: Advanced Practice Midwife

## 2021-03-15 VITALS — BP 130/80 | HR 82 | Wt 234.0 lb

## 2021-03-15 DIAGNOSIS — O0992 Supervision of high risk pregnancy, unspecified, second trimester: Secondary | ICD-10-CM

## 2021-03-15 DIAGNOSIS — Z3A22 22 weeks gestation of pregnancy: Secondary | ICD-10-CM

## 2021-03-15 NOTE — Progress Notes (Signed)
   HIGH-RISK PREGNANCY VISIT Patient name: Darlene Stanley MRN [id number]  Date of birth: 22-Sep-1980 Chief Complaint:   Routine Prenatal Visit  History of Present Illness:   Darlene Stanley is a 40 y.o. [id number] female at [redacted]w[redacted]d with an Estimated Date of Delivery: 07/14/21 being seen today for ongoing management of a high-risk pregnancy complicated by advanced maternal age.    Today she reports nausea. Contractions: Not present. Vag. Bleeding: None.  Movement: Present. denies leaking of fluid.   Depression screen [facility] 2/9 01/18/2021 12/02/2020  Decreased Interest 3 1  Down, Depressed, Hopeless 2 1  PHQ - 2 Score 5 2  Altered sleeping 2 2  Tired, decreased energy 3 3  Change in appetite 2 1  Feeling bad or failure about yourself  2 2  Trouble concentrating 3 1  Moving slowly or fidgety/restless 1 0  Suicidal thoughts 0 0  PHQ-9 Score 18 11     GAD 7 : Generalized Anxiety Score 01/18/2021 12/02/2020  Nervous, Anxious, on Edge 3 2  Control/stop worrying 2 1  Worry too much - different things 2 1  Trouble relaxing 1 1  Restless 1 0  Easily annoyed or irritable 2 1  Afraid - awful might happen 1 0  Total GAD 7 Score 12 6     Review of Systems:   Pertinent items are noted in HPI Denies abnormal vaginal discharge w/ itching/odor/irritation, headaches, visual changes, shortness of breath, chest pain, abdominal pain, severe nausea/vomiting, or problems with urination or bowel movements unless otherwise stated above. Pertinent History Reviewed:  Reviewed past medical,surgical, social, obstetrical and family history.  Reviewed problem list, medications and allergies. Physical Assessment:   Vitals:   03/15/21 0902  BP: 130/80  Pulse: 82  Weight: 234 lb (106.1 kg)  Body mass index is 38.94 kg/m.           Physical Examination:   General appearance: alert, well appearing, and in no distress  Mental status: alert, oriented to person, place, and time  Skin: warm & dry   Extremities:  Edema: None    Cardiovascular: normal heart rate noted  Respiratory: normal respiratory effort, no distress  Abdomen: gravid, soft, non-tender  Pelvic: Cervical exam deferred         Fetal Status: Fetal Heart Rate (bpm): 154   Movement: Present    Fetal Surveillance Testing today: doppler    No results found for this or any previous visit (from the past 24 hour(s)).  Assessment & Plan:  High-risk pregnancy: [id number] at [redacted]w[redacted]d with an Estimated Date of Delivery: 07/14/21   1) AMA, taking bASA; growth 32 & 36wks; ante testing to start at 36wks  2) Labile BP, elevated at 14wks; nl today; keep watching  Meds: No orders of the defined types were placed in this encounter.   Labs/procedures today: none  Treatment Plan:  PN2 at next visit  Reviewed: Preterm labor symptoms and general obstetric precautions including but not limited to vaginal bleeding, contractions, leaking of fluid and fetal movement were reviewed in detail with the patient.  All questions were answered. .  Follow-up: Return in about 4 weeks (around 04/12/2021) for HROB, PN2, in person.   Future Appointments  Date Time Provider Department Center  04/12/2021  3:50 PM Myna Hidalgo, DO [facility]    No orders of the defined types were placed in this encounter.  Arabella Merles CNM 03/15/2021 9:29 AM

## 2021-03-15 NOTE — Patient Instructions (Signed)
Darlene Stanley, I greatly value your feedback.  If you receive a survey following your visit with [place] today, we appreciate you taking the time to fill it out.  Thanks, Philipp Deputy, CNM   You will have your sugar test next visit.  Please do not eat or drink anything after midnight the night before you come, not even water.  You will be here for at least two hours.  Please make an appointment online for the bloodwork at [email or web address] for 8:30am (or as close to this as possible). Make sure you select the [facility] service center. The day of the appointment, check in with our office first, then you will go to [facility] to start the sugar test.    [facility] HAS MOVED!!! It is now [facility] ([address] [place], [place] [address]) Entrance C, located off of [address] valet parking  Go to [email or web address] to register for FREE online childbirth classes   Call the office [phone]) or go to [facility] if: You begin to have strong, frequent contractions Your water breaks.  Sometimes it is a big gush of fluid, sometimes it is just a trickle that keeps getting your panties wet or running down your legs You have vaginal bleeding.  It is normal to have a small amount of spotting if your cervix was checked.  You don't feel your baby moving like normal.  If you don't, get you something to eat and drink and lay down and focus on feeling your baby move.   If your baby is still not moving like normal, you should call the office or go to [facility].  [place] Pediatricians/Family Doctors: [facility] [phone]           [facility] [phone]                [facility] [phone] (usually not accepting new patients unless you have family there already, you are always welcome to call and ask)      [facility] Department [phone]       [facility] Pediatricians/Family Doctors:  [facility]: [phone] [facility]: [phone] [facility]: [phone]  [facility] Doctors:  [facility]: [phone]  [facility]: [phone]  [facility] Doctors: [facility]: [phone]   Home Blood Pressure Monitoring for Patients   Your provider has recommended that you check your blood pressure (BP) at least once a week at home. If you do not have a blood pressure cuff at home, one will be provided for you. Contact your provider if you have not received your monitor within 1 week.   Helpful Tips for Accurate Home Blood Pressure Checks  Don't smoke, exercise, or drink caffeine 30 minutes before checking your BP Use the restroom before checking your BP (a full bladder can raise your pressure) Relax in a comfortable upright chair Feet on the ground Left arm resting comfortably on a flat surface at the level of your heart Legs uncrossed Back supported Sit quietly and don't talk Place the cuff on your bare arm Adjust snuggly, so that only two fingertips can fit between your skin and the top of the cuff Check 2 readings separated by at least one minute Keep a log of your BP readings For a visual, please reference this diagram: http://ccnc.care/bpdiagram  Provider Name: [facility]     Phone: [phone]  Zone 1: ALL CLEAR  Continue to monitor your symptoms:  BP reading is less than 140 (top number) or less than 90 (bottom number)  No right upper stomach pain No headaches or seeing spots No feeling nauseated or throwing up No swelling in face and hands  Zone 2: CAUTION Call your doctor's office for any of the following:  BP reading is greater than 140 (top number) or greater than 90 (bottom number)  Stomach pain under your ribs in the middle or right side Headaches or seeing spots Feeling nauseated or throwing up Swelling in face and hands  Zone 3: EMERGENCY  Seek  immediate medical care if you have any of the following:  BP reading is greater than160 (top number) or greater than 110 (bottom number) Severe headaches not improving with Tylenol Serious difficulty catching your breath Any worsening symptoms from Zone 2   Second Trimester of Pregnancy The second trimester is from week 13 through week 28, months 4 through 6. The second trimester is often a time when you feel your best. Your body has also adjusted to being pregnant, and you begin to feel better physically. Usually, morning sickness has lessened or quit completely, you may have more energy, and you may have an increase in appetite. The second trimester is also a time when the fetus is growing rapidly. At the end of the sixth month, the fetus is about 9 inches long and weighs about 1 pounds. You will likely begin to feel the baby move (quickening) between 18 and 20 weeks of the pregnancy. BODY CHANGES Your body goes through many changes during pregnancy. The changes vary from woman to woman.  Your weight will continue to increase. You will notice your lower abdomen bulging out. You may begin to get stretch marks on your hips, abdomen, and breasts. You may develop headaches that can be relieved by medicines approved by your health care provider. You may urinate more often because the fetus is pressing on your bladder. You may develop or continue to have heartburn as a result of your pregnancy. You may develop constipation because certain hormones are causing the muscles that push waste through your intestines to slow down. You may develop hemorrhoids or swollen, bulging veins (varicose veins). You may have back pain because of the weight gain and pregnancy hormones relaxing your joints between the bones in your pelvis and as a result of a shift in weight and the muscles that support your balance. Your breasts will continue to grow and be tender. Your gums may bleed and may be sensitive to brushing  and flossing. Dark spots or blotches (chloasma, mask of pregnancy) may develop on your face. This will likely fade after the baby is born. A dark line from your belly button to the pubic area (linea nigra) may appear. This will likely fade after the baby is born. You may have changes in your hair. These can include thickening of your hair, rapid growth, and changes in texture. Some women also have hair loss during or after pregnancy, or hair that feels dry or thin. Your hair will most likely return to normal after your baby is born. WHAT TO EXPECT AT YOUR PRENATAL VISITS During a routine prenatal visit: You will be weighed to make sure you and the fetus are growing normally. Your blood pressure will be taken. Your abdomen will be measured to track your baby's growth. The fetal heartbeat will be listened to. Any test results from the previous visit will be discussed. Your health care provider  may ask you: How you are feeling. If you are feeling the baby move. If you have had any abnormal symptoms, such as leaking fluid, bleeding, severe headaches, or abdominal cramping. If you have any questions. Other tests that may be performed during your second trimester include: Blood tests that check for: Low iron levels (anemia). Gestational diabetes (between 24 and 28 weeks). Rh antibodies. Urine tests to check for infections, diabetes, or protein in the urine. An ultrasound to confirm the proper growth and development of the baby. An amniocentesis to check for possible genetic problems. Fetal screens for spina bifida and Down syndrome. HOME CARE INSTRUCTIONS  Avoid all smoking, herbs, alcohol, and unprescribed drugs. These chemicals affect the formation and growth of the baby. Follow your health care provider's instructions regarding medicine use. There are medicines that are either safe or unsafe to take during pregnancy. Exercise only as directed by your health care provider. Experiencing  uterine cramps is a good sign to stop exercising. Continue to eat regular, healthy meals. Wear a good support bra for breast tenderness. Do not use hot tubs, steam rooms, or saunas. Wear your seat belt at all times when driving. Avoid raw meat, uncooked cheese, cat litter boxes, and soil used by cats. These carry germs that can cause birth defects in the baby. Take your prenatal vitamins. Try taking a stool softener (if your health care provider approves) if you develop constipation. Eat more high-fiber foods, such as fresh vegetables or fruit and whole grains. Drink plenty of fluids to keep your urine clear or pale yellow. Take warm sitz baths to soothe any pain or discomfort caused by hemorrhoids. Use hemorrhoid cream if your health care provider approves. If you develop varicose veins, wear support hose. Elevate your feet for 15 minutes, 3-4 times a day. Limit salt in your diet. Avoid heavy lifting, wear low heel shoes, and practice good posture. Rest with your legs elevated if you have leg cramps or low back pain. Visit your dentist if you have not gone yet during your pregnancy. Use a soft toothbrush to brush your teeth and be gentle when you floss. A sexual relationship may be continued unless your health care provider directs you otherwise. Continue to go to all your prenatal visits as directed by your health care provider. SEEK MEDICAL CARE IF:  You have dizziness. You have mild pelvic cramps, pelvic pressure, or nagging pain in the abdominal area. You have persistent nausea, vomiting, or diarrhea. You have a bad smelling vaginal discharge. You have pain with urination. SEEK IMMEDIATE MEDICAL CARE IF:  You have a fever. You are leaking fluid from your vagina. You have spotting or bleeding from your vagina. You have severe abdominal cramping or pain. You have rapid weight gain or loss. You have shortness of breath with chest pain. You notice sudden or extreme swelling of your face,  hands, ankles, feet, or legs. You have not felt your baby move in over an hour. You have severe headaches that do not go away with medicine. You have vision changes. Document Released: 08/07/2001 Document Revised: 08/18/2013 Document Reviewed: 10/14/2012 [facility] Patient Information 2015 [place], [place]. This information is not intended to replace advice given to you by your health care provider. Make sure you discuss any questions you have with your health care provider.

## 2021-04-12 ENCOUNTER — Encounter: Payer: 59 | Admitting: Obstetrics & Gynecology

## 2021-04-13 ENCOUNTER — Other Ambulatory Visit: Payer: 59

## 2021-04-13 ENCOUNTER — Other Ambulatory Visit: Payer: Self-pay

## 2021-04-13 ENCOUNTER — Ambulatory Visit (INDEPENDENT_AMBULATORY_CARE_PROVIDER_SITE_OTHER): Payer: 59 | Admitting: Women's Health

## 2021-04-13 ENCOUNTER — Encounter: Payer: Self-pay | Admitting: Women's Health

## 2021-04-13 VITALS — BP 121/81 | HR 81 | Wt 236.0 lb

## 2021-04-13 DIAGNOSIS — O09522 Supervision of elderly multigravida, second trimester: Secondary | ICD-10-CM

## 2021-04-13 DIAGNOSIS — O0992 Supervision of high risk pregnancy, unspecified, second trimester: Secondary | ICD-10-CM

## 2021-04-13 DIAGNOSIS — Z3A26 26 weeks gestation of pregnancy: Secondary | ICD-10-CM

## 2021-04-13 NOTE — Progress Notes (Signed)
HIGH-RISK PREGNANCY VISIT Patient name: Darlene Stanley MRN [id number]  Date of birth: 29-Jul-1981 Chief Complaint:   Routine Prenatal Visit (PN2)  History of Present Illness:   Darlene Stanley is a 40 y.o. [id number] female at [redacted]w[redacted]d with an Estimated Date of Delivery: 07/14/21 being seen today for ongoing management of a high-risk pregnancy complicated by advanced maternal age.    Today she reports no complaints. Contractions: Not present. Vag. Bleeding: None.  Movement: Present. denies leaking of fluid.   Depression screen [facility] 2/9 04/13/2021 01/18/2021 12/02/2020  Decreased Interest 1 3 1   Down, Depressed, Hopeless 1 2 1   PHQ - 2 Score 2 5 2   Altered sleeping 2 2 2   Tired, decreased energy 2 3 3   Change in appetite 1 2 1   Feeling bad or failure about yourself  1 2 2   Trouble concentrating 3 3 1   Moving slowly or fidgety/restless 0 1 0  Suicidal thoughts 0 0 0  PHQ-9 Score 11 18 11      GAD 7 : Generalized Anxiety Score 04/13/2021 01/18/2021 12/02/2020  Nervous, Anxious, on Edge 2 3 2   Control/stop worrying 2 2 1   Worry too much - different things 2 2 1   Trouble relaxing 2 1 1   Restless 2 1 0  Easily annoyed or irritable 0 2 1  Afraid - awful might happen 0 1 0  Total GAD 7 Score 10 12 6      Review of Systems:   Pertinent items are noted in HPI Denies abnormal vaginal discharge w/ itching/odor/irritation, headaches, visual changes, shortness of breath, chest pain, abdominal pain, severe nausea/vomiting, or problems with urination or bowel movements unless otherwise stated above. Pertinent History Reviewed:  Reviewed past medical,surgical, social, obstetrical and family history.  Reviewed problem list, medications and allergies. Physical Assessment:   Vitals:   04/13/21 0909  BP: 121/81  Pulse: 81  Weight: 236 lb (107 kg)  Body mass index is 39.27 kg/m.           Physical Examination:   General appearance: alert, well appearing, and in no distress  Mental status: alert,  oriented to person, place, and time  Skin: warm & dry   Extremities: Edema: None    Cardiovascular: normal heart rate noted  Respiratory: normal respiratory effort, no distress  Abdomen: gravid, soft, non-tender  Pelvic: Cervical exam deferred         Fetal Status: Fetal Heart Rate (bpm): 135 Fundal Height: 28 cm Movement: Present    Fetal Surveillance Testing today: doppler   Chaperone: N/A    No results found for this or any previous visit (from the past 24 hour(s)).  Assessment & Plan:  High-risk pregnancy: at [redacted]w[redacted]d with an Estimated Date of Delivery: 07/14/21   1) AMA, stable, continua ASA  Meds: No orders of the defined types were placed in this encounter.   Labs/procedures today: PN2 and declines tdap today, plans next visit  Treatment Plan:   U/S @  32, 36wks       2x/wk testing nst/sono @ 36wks        Deliver @ 39-40wks:____   Reviewed: Preterm labor symptoms and general obstetric precautions including but not limited to vaginal bleeding, contractions, leaking of fluid and fetal movement were reviewed in detail with the patient.  All questions were answered. Does have home bp cuff. Office bp cuff given: not applicable. Check bp weekly, let know if consistently >140 and/or >90.  Follow-up: Return in about 4 weeks (around 05/11/2021)  for HROB, MD or CNM, in person.   Future Appointments  Date Time Provider Department Center  05/12/2021 12:30 PM Myna Hidalgo, DO [facility]    No orders of the defined types were placed in this encounter.  Cheral Marker CNM, [facility] 04/13/2021 9:48 AM

## 2021-04-13 NOTE — Patient Instructions (Signed)
[place], thank you for choosing our office today! We appreciate the opportunity to meet your healthcare needs. You may receive a short survey by mail, e-mail, or through MyChart. If you are happy with your care we would appreciate if you could take just a few minutes to complete the survey questions. We read all of your comments and take your feedback very seriously. Thank you again for choosing our office.  [facility] ([address] [place], [place] [address]) Entrance C, located off of [address] Free 24/7 valet parking   CLASSES: Go to [email or web address] to register for classes (childbirth, breastfeeding, waterbirth, infant CPR, daddy bootcamp, etc.)  Call the office ([phone]) or go to [facility] if: You begin to have strong, frequent contractions Your water breaks.  Sometimes it is a big gush of fluid, sometimes it is just a trickle that keeps getting your panties wet or running down your legs You have vaginal bleeding.  It is normal to have a small amount of spotting if your cervix was checked.  You don't feel your baby moving like normal.  If you don't, get you something to eat and drink and lay down and focus on feeling your baby move.   If your baby is still not moving like normal, you should call the office or go to [facility].  Call the office ([phone]) or go to [facility] for these signs of pre-eclampsia: Severe headache that does not go away with Tylenol Visual changes- seeing spots, double, blurred vision Pain under your right breast or upper abdomen that does not go away with Tums or heartburn medicine Nausea and/or vomiting Severe swelling in your hands, feet, and face   Tdap Vaccine It is recommended that you get the Tdap vaccine during the third trimester of EACH pregnancy to help protect your baby from getting pertussis (whooping cough) 27-36 weeks is the BEST time to do  this so that you can pass the protection on to your baby. During pregnancy is better than after pregnancy, but if you are unable to get it during pregnancy it will be offered at the hospital.  You can get this vaccine with us, at the health department, your family doctor, or some local pharmacies Everyone who will be around your baby should also be up-to-date on their vaccines before the baby comes. Adults (who are not pregnant) only need 1 dose of Tdap during adulthood.   [place] Pediatricians/Family Doctors [facility] ([facility]): [address], [phone]           [facility]: [address], [phone]                [facility] ([facility]): [address], [phone] (call to ask if accepting patients) [facility]: [address], [place], [phone]    [place] Pediatricians/Family Doctors [facility] ([facility]): [address], [phone] [facility]: [address], [phone] [facility]: [address], [phone]  [place] [facility]  [facility] ([facility]): [phone] [facility]: [address], [phone]   [place] Family Doctors [facility]: [address], [phone]  [place] [facility]  [facility]: [address], [phone]  Home Blood Pressure Monitoring for Patients   Your provider has recommended that you check your   blood pressure (BP) at least once a week at home. If you do not have a blood pressure cuff at home, one will be provided for you. Contact your provider if you have not received your monitor within 1 week.   Helpful Tips for Accurate Home Blood Pressure Checks  Don't smoke, exercise, or drink caffeine 30 minutes before checking your BP Use the restroom before checking your BP (a full bladder can raise your  pressure) Relax in a comfortable upright chair Feet on the ground Left arm resting comfortably on a flat surface at the level of your heart Legs uncrossed Back supported Sit quietly and don't talk Place the cuff on your bare arm Adjust snuggly, so that only two fingertips can fit between your skin and the top of the cuff Check 2 readings separated by at least one minute Keep a log of your BP readings For a visual, please reference this diagram: http://ccnc.care/bpdiagram  Provider Name: [facility]     Phone: [phone]  Zone 1: ALL CLEAR  Continue to monitor your symptoms:  BP reading is less than 140 (top number) or less than 90 (bottom number)  No right upper stomach pain No headaches or seeing spots No feeling nauseated or throwing up No swelling in face and hands  Zone 2: CAUTION Call your doctor's office for any of the following:  BP reading is greater than 140 (top number) or greater than 90 (bottom number)  Stomach pain under your ribs in the middle or right side Headaches or seeing spots Feeling nauseated or throwing up Swelling in face and hands  Zone 3: EMERGENCY  Seek immediate medical care if you have any of the following:  BP reading is greater than160 (top number) or greater than 110 (bottom number) Severe headaches not improving with Tylenol Serious difficulty catching your breath Any worsening symptoms from Zone 2   Third Trimester of Pregnancy The third trimester is from week 29 through week 42, months 7 through 9. The third trimester is a time when the fetus is growing rapidly. At the end of the ninth month, the fetus is about 20 inches in length and weighs 6-10 pounds.  BODY CHANGES Your body goes through many changes during pregnancy. The changes vary from woman to woman.  Your weight will continue to increase. You can expect to gain 25-35 pounds (11-16 kg) by the end of the pregnancy. You may begin to get stretch marks on your hips, abdomen,  and breasts. You may urinate more often because the fetus is moving lower into your pelvis and pressing on your bladder. You may develop or continue to have heartburn as a result of your pregnancy. You may develop constipation because certain hormones are causing the muscles that push waste through your intestines to slow down. You may develop hemorrhoids or swollen, bulging veins (varicose veins). You may have pelvic pain because of the weight gain and pregnancy hormones relaxing your joints between the bones in your pelvis. Backaches may result from overexertion of the muscles supporting your posture. You may have changes in your hair. These can include thickening of your hair, rapid growth, and changes in texture. Some women also have hair loss during or after pregnancy, or hair that feels dry or thin. Your hair will most likely return to normal after your baby is born. Your breasts will continue to grow and be tender. A yellow discharge may leak from your breasts called colostrum. Your belly button may stick out. You may   feel short of breath because of your expanding uterus. You may notice the fetus "dropping," or moving lower in your abdomen. You may have a bloody mucus discharge. This usually occurs a few days to a week before labor begins. Your cervix becomes thin and soft (effaced) near your due date. WHAT TO EXPECT AT YOUR PRENATAL EXAMS  You will have prenatal exams every 2 weeks until week 36. Then, you will have weekly prenatal exams. During a routine prenatal visit: You will be weighed to make sure you and the fetus are growing normally. Your blood pressure is taken. Your abdomen will be measured to track your baby's growth. The fetal heartbeat will be listened to. Any test results from the previous visit will be discussed. You may have a cervical check near your due date to see if you have effaced. At around 36 weeks, your caregiver will check your cervix. At the same time, your  caregiver will also perform a test on the secretions of the vaginal tissue. This test is to determine if a type of bacteria, Group B streptococcus, is present. Your caregiver will explain this further. Your caregiver may ask you: What your birth plan is. How you are feeling. If you are feeling the baby move. If you have had any abnormal symptoms, such as leaking fluid, bleeding, severe headaches, or abdominal cramping. If you have any questions. Other tests or screenings that may be performed during your third trimester include: Blood tests that check for low iron levels (anemia). Fetal testing to check the health, activity level, and growth of the fetus. Testing is done if you have certain medical conditions or if there are problems during the pregnancy. FALSE LABOR You may feel small, irregular contractions that eventually go away. These are called Braxton Hicks contractions, or false labor. Contractions may last for hours, days, or even weeks before true labor sets in. If contractions come at regular intervals, intensify, or become painful, it is best to be seen by your caregiver.  SIGNS OF LABOR  Menstrual-like cramps. Contractions that are 5 minutes apart or less. Contractions that start on the top of the uterus and spread down to the lower abdomen and back. A sense of increased pelvic pressure or back pain. A watery or bloody mucus discharge that comes from the vagina. If you have any of these signs before the 37th week of pregnancy, call your caregiver right away. You need to go to the hospital to get checked immediately. HOME CARE INSTRUCTIONS  Avoid all smoking, herbs, alcohol, and unprescribed drugs. These chemicals affect the formation and growth of the baby. Follow your caregiver's instructions regarding medicine use. There are medicines that are either safe or unsafe to take during pregnancy. Exercise only as directed by your caregiver. Experiencing uterine cramps is a good sign to  stop exercising. Continue to eat regular, healthy meals. Wear a good support bra for breast tenderness. Do not use hot tubs, steam rooms, or saunas. Wear your seat belt at all times when driving. Avoid raw meat, uncooked cheese, cat litter boxes, and soil used by cats. These carry germs that can cause birth defects in the baby. Take your prenatal vitamins. Try taking a stool softener (if your caregiver approves) if you develop constipation. Eat more high-fiber foods, such as fresh vegetables or fruit and whole grains. Drink plenty of fluids to keep your urine clear or pale yellow. Take warm sitz baths to soothe any pain or discomfort caused by hemorrhoids. Use hemorrhoid cream if   your caregiver approves. If you develop varicose veins, wear support hose. Elevate your feet for 15 minutes, 3-4 times a day. Limit salt in your diet. Avoid heavy lifting, wear low heal shoes, and practice good posture. Rest a lot with your legs elevated if you have leg cramps or low back pain. Visit your dentist if you have not gone during your pregnancy. Use a soft toothbrush to brush your teeth and be gentle when you floss. A sexual relationship may be continued unless your caregiver directs you otherwise. Do not travel far distances unless it is absolutely necessary and only with the approval of your caregiver. Take prenatal classes to understand, practice, and ask questions about the labor and delivery. Make a trial run to the hospital. Pack your hospital bag. Prepare the baby's nursery. Continue to go to all your prenatal visits as directed by your caregiver. SEEK MEDICAL CARE IF: You are unsure if you are in labor or if your water has broken. You have dizziness. You have mild pelvic cramps, pelvic pressure, or nagging pain in your abdominal area. You have persistent nausea, vomiting, or diarrhea. You have a bad smelling vaginal discharge. You have pain with urination. SEEK IMMEDIATE MEDICAL CARE IF:  You  have a fever. You are leaking fluid from your vagina. You have spotting or bleeding from your vagina. You have severe abdominal cramping or pain. You have rapid weight loss or gain. You have shortness of breath with chest pain. You notice sudden or extreme swelling of your face, hands, ankles, feet, or legs. You have not felt your baby move in over an hour. You have severe headaches that do not go away with medicine. You have vision changes. Document Released: 08/07/2001 Document Revised: 08/18/2013 Document Reviewed: 10/14/2012 ExitCare Patient Information 2015 [employer]. This information is not intended to replace advice given to you by your health care provider. Make sure you discuss any questions you have with your health care provider.       

## 2021-04-14 ENCOUNTER — Encounter: Payer: Self-pay | Admitting: Women's Health

## 2021-04-14 DIAGNOSIS — O24410 Gestational diabetes mellitus in pregnancy, diet controlled: Secondary | ICD-10-CM | POA: Insufficient documentation

## 2021-04-14 DIAGNOSIS — Z8632 Personal history of gestational diabetes: Secondary | ICD-10-CM | POA: Insufficient documentation

## 2021-04-16 DIAGNOSIS — O24410 Gestational diabetes mellitus in pregnancy, diet controlled: Secondary | ICD-10-CM

## 2021-04-17 ENCOUNTER — Other Ambulatory Visit: Payer: Self-pay | Admitting: Women's Health

## 2021-04-17 MED ORDER — ONETOUCH ULTRASOFT LANCETS MISC: 99 refills | Status: DC

## 2021-04-17 MED ORDER — GLUCOSE BLOOD VI STRP: ORAL_STRIP | 99 refills | Status: DC

## 2021-04-17 MED ORDER — HYDROCORTISONE ACETATE 25 MG RE SUPP: 25.0000 mg | Freq: Two times a day (BID) | RECTAL | 0 refills | Status: DC

## 2021-04-17 MED ORDER — ONETOUCH VERIO W/DEVICE KIT: PACK | 0 refills | Status: DC

## 2021-04-18 LAB — AB SCR+ANTIBODY ID: Antibody Screen: POSITIVE — AB

## 2021-04-18 LAB — CBC
Hematocrit: 37 % (ref 34.0–46.6)
Hemoglobin: 11.9 g/dL (ref 11.1–15.9)
MCH: 29 pg (ref 26.6–33.0)
MCHC: 32.2 g/dL (ref 31.5–35.7)
MCV: 90 fL (ref 79–97)
Platelets: 276 10*3/uL (ref 150–450)
RBC: 4.11 x10E6/uL (ref 3.77–5.28)
RDW: 13.7 % (ref 11.7–15.4)
WBC: 9.9 10*3/uL (ref 3.4–10.8)

## 2021-04-18 LAB — GLUCOSE TOLERANCE, 2 HOURS W/ 1HR
Glucose, 1 hour: 188 mg/dL — ABNORMAL HIGH (ref 65–179)
Glucose, 2 hour: 110 mg/dL (ref 65–152)
Glucose, Fasting: 90 mg/dL (ref 65–91)

## 2021-04-18 LAB — HIV ANTIBODY (ROUTINE TESTING W REFLEX): HIV Screen 4th Generation wRfx: NONREACTIVE

## 2021-04-18 LAB — RPR: RPR Ser Ql: NONREACTIVE

## 2021-04-18 LAB — ANTIBODY SCREEN

## 2021-05-03 ENCOUNTER — Encounter: Payer: Self-pay | Admitting: Registered"

## 2021-05-03 ENCOUNTER — Encounter: Payer: Medicaid Other | Attending: Women's Health | Admitting: Registered"

## 2021-05-03 ENCOUNTER — Other Ambulatory Visit: Payer: Self-pay

## 2021-05-03 DIAGNOSIS — O24410 Gestational diabetes mellitus in pregnancy, diet controlled: Secondary | ICD-10-CM | POA: Diagnosis present

## 2021-05-03 NOTE — Progress Notes (Signed)
Patient was seen on 05/02/21 for Gestational Diabetes self-management class at [facility]. The following learning objectives were met by the patient during this course:  States the definition of Gestational Diabetes States why dietary management is important in controlling blood glucose Describes the effects each nutrient has on blood glucose levels Demonstrates ability to create a balanced meal plan Demonstrates carbohydrate counting  States when to check blood glucose levels Demonstrates proper blood glucose monitoring techniques States the effect of stress and exercise on blood glucose levels States the importance of limiting caffeine and abstaining from alcohol and smoking  Blood glucose monitor given: Patient has meter and is checking blood sugar prior to class   Patient instructed to monitor glucose levels: FBS: 60 - <95; 1 hour: <140; 2 hour: <120  Patient received handouts: Nutrition Diabetes and Pregnancy, including carb counting list  Patient will be seen for follow-up as needed. 

## 2021-05-04 ENCOUNTER — Other Ambulatory Visit: Payer: Self-pay

## 2021-05-04 MED ORDER — GLUCOSE BLOOD VI STRP: ORAL_STRIP | 99 refills | Status: DC

## 2021-05-05 ENCOUNTER — Other Ambulatory Visit: Payer: Self-pay | Admitting: *Deleted

## 2021-05-05 DIAGNOSIS — O24410 Gestational diabetes mellitus in pregnancy, diet controlled: Secondary | ICD-10-CM

## 2021-05-05 MED ORDER — ACCU-CHEK GUIDE ME W/DEVICE KIT
PACK | 0 refills | Status: DC
Start: 2021-05-05 — End: 2021-07-16

## 2021-05-12 ENCOUNTER — Encounter: Payer: Self-pay | Admitting: Obstetrics & Gynecology

## 2021-05-12 ENCOUNTER — Ambulatory Visit (INDEPENDENT_AMBULATORY_CARE_PROVIDER_SITE_OTHER): Payer: 59 | Admitting: Obstetrics & Gynecology

## 2021-05-12 ENCOUNTER — Other Ambulatory Visit: Payer: Self-pay

## 2021-05-12 VITALS — BP 127/85 | HR 105 | Wt 233.0 lb

## 2021-05-12 DIAGNOSIS — Z3A31 31 weeks gestation of pregnancy: Secondary | ICD-10-CM

## 2021-05-12 DIAGNOSIS — O0993 Supervision of high risk pregnancy, unspecified, third trimester: Secondary | ICD-10-CM

## 2021-05-12 DIAGNOSIS — Z23 Encounter for immunization: Secondary | ICD-10-CM

## 2021-05-12 NOTE — Progress Notes (Signed)
HIGH-RISK PREGNANCY VISIT Patient name: Darlene Stanley MRN [id number]  Date of birth: [birth year] Chief Complaint:   Routine Prenatal Visit  History of Present Illness:   Darlene Stanley is a 40 y.o. [id number] female at 30w0dwith an Estimated Date of Delivery: 07/14/21 being seen today for ongoing management of a high-risk pregnancy complicated by:  -GDMA1- well controlled with diet- occasional elevated fasting -AMA -Bipolar d/o no meds -COPD- well controlled  Today she reports no complaints.   Contractions: Not present. Vag. Bleeding: None.  Movement: Present. denies leaking of fluid.   [facility]2021 04/13/2021 01/18/2021 12/02/2020  Decreased Interest 0 '1 3 1  ' Down, Depressed, Hopeless 0 '1 2 1  ' PHQ - 2 Score 0 '2 5 2  ' Altered sleeping - '2 2 2  ' Tired, decreased energy - '2 3 3  ' Change in appetite - '1 2 1  ' Feeling bad or failure about yourself  - '1 2 2  ' Trouble concentrating - '3 3 1  ' Moving slowly or fidgety/restless - 0 1 0  Suicidal thoughts - 0 0 0  PHQ-9 Score - '11 18 11     ' Current Outpatient Medications  Medication Instructions   ALBUTEROL IN Inhalation, As needed   aspirin 162 mg, Oral, Daily, Swallow whole.   Blood Glucose Monitoring Suppl (ACCU-CHEK GUIDE ME) w/Device KIT Use as directed to check sugar QID.   Blood Glucose Monitoring Suppl (ONETOUCH VERIO) w/Device KIT Use as directed to check blood sugar 4 times daily   budesonide-formoterol (SYMBICORT) 160-4.5 MCG/ACT inhaler 2 puffs, Inhalation, 2 times daily   Cyanocobalamin (VITAMIN B-12 PO) Oral   Doxylamine-Pyridoxine (DICLEGIS) 10-10 MG TBEC 2 tabs q hs, if sx persist add 1 tab q am on day 3, if sx persist add 1 tab q afternoon on day 4   glucose blood test strip Use as instructed to check blood sugar 4 times daily   hydrocortisone (ANUSOL-HC) 25 mg, Rectal, 2 times daily   Lancets (ONETOUCH ULTRASOFT) lancets Use as directed to check blood sugar 4 times daily   Prenatal Vit-Fe Fumarate-FA  (PRENATAL VITAMIN PO) Oral   UNABLE TO FIND [place]-daily     Review of Systems:   Pertinent items are noted in HPI Denies abnormal vaginal discharge w/ itching/odor/irritation, headaches, visual changes, shortness of breath, chest pain, abdominal pain, severe nausea/vomiting, or problems with urination or bowel movements unless otherwise stated above. Pertinent History Reviewed:  Reviewed past medical,surgical, social, obstetrical and family history.  Reviewed problem list, medications and allergies. Physical Assessment:   Vitals:   05/12/21 1238  BP: 127/85  Pulse: (!) 105  Weight: 233 lb (105.7 kg)  Body mass index is 38.77 kg/m.           Physical Examination:   General appearance: alert, well appearing, and in no distress  Mental status: alert, oriented to person, place, and time  Skin: warm & dry   Extremities: Edema: None    Cardiovascular: normal heart rate noted  Respiratory: normal respiratory effort, no distress  Abdomen: gravid, soft, non-tender  Pelvic: Cervical exam deferred         Fetal Status: Fetal Heart Rate (bpm): 140 Fundal Height: 32 cm Movement: Present    Fetal Surveillance Testing today: doppler   Chaperone: N/A    No results found for this or any previous visit (from the past 24 hour(s)).   Assessment & Plan:  High-risk pregnancy: [id number] 371w0dith an Estimated Date of Delivery: 07/14/21  1) GDMA1- occasional elevated fasting- reviewed management '[]'  growth next visit  -AMA -Bipolar d/o- mood appropriate  Meds: No orders of the defined types were placed in this encounter.   Labs/procedures today: none  Treatment Plan:  growth scan next visit  Reviewed: Term labor symptoms and general obstetric precautions including but not limited to vaginal bleeding, contractions, leaking of fluid and fetal movement were reviewed in detail with the patient.  All questions were answered. Pt has home bp cuff. Check bp weekly, let us know if >140/90.    Follow-up: Return in about 2 weeks (around 05/26/2021) for [place] visit and [place] growth, .   Future Appointments  Date Time Provider [place]  05/31/2021 11:00 AM [facility] [place] [facility] None  05/31/2021 11:50 AM Myrtis Ser, CNM [facility]    Orders Placed This Encounter  Procedures   Tdap vaccine greater than or equal to 7yo IM    Janyth Pupa, DO Attending [place], [facility] for [employer], [place]

## 2021-05-30 ENCOUNTER — Other Ambulatory Visit: Payer: Self-pay | Admitting: Obstetrics & Gynecology

## 2021-05-30 DIAGNOSIS — O09523 Supervision of elderly multigravida, third trimester: Secondary | ICD-10-CM

## 2021-05-31 ENCOUNTER — Ambulatory Visit (INDEPENDENT_AMBULATORY_CARE_PROVIDER_SITE_OTHER): Payer: 59

## 2021-05-31 ENCOUNTER — Ambulatory Visit (INDEPENDENT_AMBULATORY_CARE_PROVIDER_SITE_OTHER): Payer: 59 | Admitting: Advanced Practice Midwife

## 2021-05-31 ENCOUNTER — Other Ambulatory Visit: Payer: Self-pay

## 2021-05-31 VITALS — BP 107/81 | HR 83 | Wt 231.8 lb

## 2021-05-31 DIAGNOSIS — O0993 Supervision of high risk pregnancy, unspecified, third trimester: Secondary | ICD-10-CM

## 2021-05-31 DIAGNOSIS — Z1389 Encounter for screening for other disorder: Secondary | ICD-10-CM

## 2021-05-31 DIAGNOSIS — Z029 Encounter for administrative examinations, unspecified: Secondary | ICD-10-CM

## 2021-05-31 DIAGNOSIS — Z3A33 33 weeks gestation of pregnancy: Secondary | ICD-10-CM

## 2021-05-31 DIAGNOSIS — O09523 Supervision of elderly multigravida, third trimester: Secondary | ICD-10-CM | POA: Diagnosis not present

## 2021-05-31 LAB — POCT URINALYSIS DIPSTICK OB
Blood, UA: NEGATIVE
Glucose, UA: NEGATIVE
Leukocytes, UA: NEGATIVE
Nitrite, UA: NEGATIVE
POC,PROTEIN,UA: NEGATIVE

## 2021-05-31 MED ORDER — BUDESONIDE-FORMOTEROL FUMARATE 160-4.5 MCG/ACT IN AERO: 2.0000 | INHALATION_SPRAY | Freq: Two times a day (BID) | RESPIRATORY_TRACT | 2 refills | Status: AC

## 2021-05-31 NOTE — Progress Notes (Signed)
[place] 33+5 wks,cephalic,posterior placenta gr 0,AFI 18 cm,fhr 130 bpm,EFW 2348 g 54%

## 2021-05-31 NOTE — Progress Notes (Signed)
HIGH-RISK PREGNANCY VISIT Patient name: Darlene Stanley MRN [id number]  Date of birth: Nov 11, 1980 Chief Complaint:   Routine Prenatal Visit, High Risk Gestation, and Pregnancy Ultrasound  History of Present Illness:   Darlene Stanley is a 40 y.o. [id number] female at [redacted]w[redacted]d with an Estimated Date of Delivery: 07/14/21 being seen today for ongoing management of a high-risk pregnancy complicated by advanced maternal age and diabetes mellitus A1DM.    Today she reports no complaints. Contractions: Irritability. Vag. Bleeding: None.  Movement: Present. denies leaking of fluid.   Depression screen [facility] 2/9 05/03/2021 04/13/2021 01/18/2021 12/02/2020  Decreased Interest 0 1 3 1   Down, Depressed, Hopeless 0 1 2 1   PHQ - 2 Score 0 2 5 2   Altered sleeping - 2 2 2   Tired, decreased energy - 2 3 3   Change in appetite - 1 2 1   Feeling bad or failure about yourself  - 1 2 2   Trouble concentrating - 3 3 1   Moving slowly or fidgety/restless - 0 1 0  Suicidal thoughts - 0 0 0  PHQ-9 Score - 11 18 11      GAD 7 : Generalized Anxiety Score 04/13/2021 01/18/2021 12/02/2020  Nervous, Anxious, on Edge 2 3 2   Control/stop worrying 2 2 1   Worry too much - different things 2 2 1   Trouble relaxing 2 1 1   Restless 2 1 0  Easily annoyed or irritable 0 2 1  Afraid - awful might happen 0 1 0  Total GAD 7 Score 10 12 6      Review of Systems:   Pertinent items are noted in HPI Denies abnormal vaginal discharge w/ itching/odor/irritation, headaches, visual changes, shortness of breath, chest pain, abdominal pain, severe nausea/vomiting, or problems with urination or bowel movements unless otherwise stated above. Pertinent History Reviewed:  Reviewed past medical,surgical, social, obstetrical and family history.  Reviewed problem list, medications and allergies. Physical Assessment:   Vitals:   05/31/21 1149  BP: 107/81  Pulse: 83  Weight: 231 lb 12.8 oz (105.1 kg)  Body mass index is 38.57 kg/m.            Physical Examination:   General appearance: alert, well appearing, and in no distress  Mental status: alert, oriented to person, place, and time  Skin: warm & dry   Extremities: Edema: Trace    Cardiovascular: normal heart rate noted  Respiratory: normal respiratory effort, no distress  Abdomen: gravid, soft, non-tender  Pelvic: Cervical exam deferred         Fetal Status: Fetal Heart Rate (bpm): 130 u/s   Movement: Present    Fetal Surveillance Testing today: 33+5 wks,cephalic,posterior placenta gr 0,AFI 18 cm,fhr 130 bpm,EFW 2348 g 54%    Results for orders placed or performed in visit on 05/31/21 (from the past 24 hour(s))  POC Urinalysis Dipstick OB   Collection Time: 05/31/21 11:57 AM  Result Value Ref Range   Color, UA     Clarity, UA     Glucose, UA Negative Negative   Bilirubin, UA     Ketones, UA mod    Spec Grav, UA     Blood, UA neg    pH, UA     POC,PROTEIN,UA Negative Negative, Trace, Small (1+), Moderate (2+), Large (3+), 4+   Urobilinogen, UA     Nitrite, UA neg    Leukocytes, UA Negative Negative   Appearance     Odor      Assessment & Plan:  High-risk pregnancy:  at [redacted]w[redacted]d with an Estimated Date of Delivery: 07/14/21   1) GDMA1, CBGs within range; EFW 54th% today; IOL 39-40wk  2) AMA, start 2x/wk testing next visit (36wk); IOL 39-40wks  3) COPD, refilled Symbocort (takes qod)  Meds:  Meds ordered this encounter  Medications   budesonide-formoterol (SYMBICORT) 160-4.5 MCG/ACT inhaler    Sig: Inhale 2 puffs into the lungs 2 (two) times daily.    Dispense:  1 each    Refill:  2    Order Specific Question:   Supervising Provider    Answer:   Lazaro Arms [[id number]]    Labs/procedures today: U/S  Treatment Plan:  begin 2x/wk testing at next visit (36wks); IOL 39-40wk  Reviewed: Preterm labor symptoms and general obstetric precautions including but not limited to vaginal bleeding, contractions, leaking of fluid and fetal movement were  reviewed in detail with the patient.  All questions were answered. Does have home bp cuff. Office bp cuff given: not applicable. Check bp weekly, let us know if consistently >140 and/or >90.  Follow-up: Return in about 16 days (around 06/16/2021) for HROB, in person; start 2x wk testing (NST/BPP) and schedule out.   No future appointments.  Orders Placed This Encounter  Procedures   POC Urinalysis Dipstick OB   Arabella Merles [facility] 05/31/2021 12:20 PM

## 2021-05-31 NOTE — Telephone Encounter (Signed)
Pt states she plans to work until delivery & will return to work by our recommendations, pt works from home

## 2021-05-31 NOTE — Patient Instructions (Signed)
Darlene Stanley, thank you for choosing our office today! We appreciate the opportunity to meet your healthcare needs. You may receive a short survey by mail, e-mail, or through MyChart. If you are happy with your care we would appreciate if you could take just a few minutes to complete the survey questions. We read all of your comments and take your feedback very seriously. Thank you again for choosing our office.  [facility] ([address] [place], [place] [address]) Entrance C, located off of [address] Free 24/7 valet parking   CLASSES: Go to [email or web address] to register for classes (childbirth, breastfeeding, waterbirth, infant CPR, daddy bootcamp, etc.)  Call the office ([phone]) or go to [facility] if: You begin to have strong, frequent contractions Your water breaks.  Sometimes it is a big gush of fluid, sometimes it is just a trickle that keeps getting your panties wet or running down your legs You have vaginal bleeding.  It is normal to have a small amount of spotting if your cervix was checked.  You don't feel your baby moving like normal.  If you don't, get you something to eat and drink and lay down and focus on feeling your baby move.   If your baby is still not moving like normal, you should call the office or go to [facility].  Call the office ([phone]) or go to [facility] for these signs of pre-eclampsia: Severe headache that does not go away with Tylenol Visual changes- seeing spots, double, blurred vision Pain under your right breast or upper abdomen that does not go away with Tums or heartburn medicine Nausea and/or vomiting Severe swelling in your hands, feet, and face   Tdap Vaccine It is recommended that you get the Tdap vaccine during the third trimester of EACH pregnancy to help protect your baby from getting pertussis (whooping cough) 27-36 weeks is the BEST time to do  this so that you can pass the protection on to your baby. During pregnancy is better than after pregnancy, but if you are unable to get it during pregnancy it will be offered at the hospital.  You can get this vaccine with us, at the health department, your family doctor, or some local pharmacies Everyone who will be around your baby should also be up-to-date on their vaccines before the baby comes. Adults (who are not pregnant) only need 1 dose of Tdap during adulthood.   [place] Pediatric[facility]/Family Doctors [facility] ([facility]): [address], [phone]           [facility]: [address], [phone]                [facility] ([facility]): [address], [phone] (call to ask if accepting patients) [facility]: [address], [place], [phone]    [place] Pediatricians/Family Doctors [facility] ([facility]): [address], [phone] [facility]: [address], [phone] [facility]: [address], [phone]  [place] [facility]  [facility] ([facility]): [phone] [facility]: [address], [phone]   [place] [facility]: [address], [phone]  [place] [facility]  [facility]: [address], [phone]  Home Blood Pressure Monitoring for Patients   Your provider has recommended that you check your   blood pressure (BP) at least once a week at home. If you do not have a blood pressure cuff at home, one will be provided for you. Contact your provider if you have not received your monitor within 1 week.   Helpful Tips for Accurate Home Blood Pressure Checks  Don't smoke, exercise, or drink caffeine 30 minutes before checking your BP Use the restroom before checking your BP (a full bladder can raise your  pressure) Relax in a comfortable upright chair Feet on the ground Left arm resting comfortably on a flat surface at the level of your heart Legs uncrossed Back supported Sit quietly and don't talk Place the cuff on your bare arm Adjust snuggly, so that only two fingertips can fit between your skin and the top of the cuff Check 2 readings separated by at least one minute Keep a log of your BP readings For a visual, please reference this diagram: http://ccnc.care/bpdiagram  Provider Name: [facility]     Phone: [phone]  Zone 1: ALL CLEAR  Continue to monitor your symptoms:  BP reading is less than 140 (top number) or less than 90 (bottom number)  No right upper stomach pain No headaches or seeing spots No feeling nauseated or throwing up No swelling in face and hands  Zone 2: CAUTION Call your doctor's office for any of the following:  BP reading is greater than 140 (top number) or greater than 90 (bottom number)  Stomach pain under your ribs in the middle or right side Headaches or seeing spots Feeling nauseated or throwing up Swelling in face and hands  Zone 3: EMERGENCY  Seek immediate medical care if you have any of the following:  BP reading is greater than160 (top number) or greater than 110 (bottom number) Severe headaches not improving with Tylenol Serious difficulty catching your breath Any worsening symptoms from Zone 2   Third Trimester of Pregnancy The third trimester is from week 29 through week 42, months 7 through 9. The third trimester is a time when the fetus is growing rapidly. At the end of the ninth month, the fetus is about 20 inches in length and weighs 6-10 pounds.  BODY CHANGES Your body goes through many changes during pregnancy. The changes vary from woman to woman.  Your weight will continue to increase. You can expect to gain 25-35 pounds (11-16 kg) by the end of the pregnancy. You may begin to get stretch marks on your hips, abdomen,  and breasts. You may urinate more often because the fetus is moving lower into your pelvis and pressing on your bladder. You may develop or continue to have heartburn as a result of your pregnancy. You may develop constipation because certain hormones are causing the muscles that push waste through your intestines to slow down. You may develop hemorrhoids or swollen, bulging veins (varicose veins). You may have pelvic pain because of the weight gain and pregnancy hormones relaxing your joints between the bones in your pelvis. Backaches may result from overexertion of the muscles supporting your posture. You may have changes in your hair. These can include thickening of your hair, rapid growth, and changes in texture. Some women also have hair loss during or after pregnancy, or hair that feels dry or thin. Your hair will most likely return to normal after your baby is born. Your breasts will continue to grow and be tender. A yellow discharge may leak from your breasts called colostrum. Your belly button may stick out. You may   feel short of breath because of your expanding uterus. You may notice the fetus "dropping," or moving lower in your abdomen. You may have a bloody mucus discharge. This usually occurs a few days to a week before labor begins. Your cervix becomes thin and soft (effaced) near your due date. WHAT TO EXPECT AT YOUR PRENATAL EXAMS  You will have prenatal exams every 2 weeks until week 36. Then, you will have weekly prenatal exams. During a routine prenatal visit: You will be weighed to make sure you and the fetus are growing normally. Your blood pressure is taken. Your abdomen will be measured to track your baby's growth. The fetal heartbeat will be listened to. Any test results from the previous visit will be discussed. You may have a cervical check near your due date to see if you have effaced. At around 36 weeks, your caregiver will check your cervix. At the same time, your  caregiver will also perform a test on the secretions of the vaginal tissue. This test is to determine if a type of bacteria, Group B streptococcus, is present. Your caregiver will explain this further. Your caregiver may ask you: What your birth plan is. How you are feeling. If you are feeling the baby move. If you have had any abnormal symptoms, such as leaking fluid, bleeding, severe headaches, or abdominal cramping. If you have any questions. Other tests or screenings that may be performed during your third trimester include: Blood tests that check for low iron levels (anemia). Fetal testing to check the health, activity level, and growth of the fetus. Testing is done if you have certain medical conditions or if there are problems during the pregnancy. FALSE LABOR You may feel small, irregular contractions that eventually go away. These are called Braxton Hicks contractions, or false labor. Contractions may last for hours, days, or even weeks before true labor sets in. If contractions come at regular intervals, intensify, or become painful, it is best to be seen by your caregiver.  SIGNS OF LABOR  Menstrual-like cramps. Contractions that are 5 minutes apart or less. Contractions that start on the top of the uterus and spread down to the lower abdomen and back. A sense of increased pelvic pressure or back pain. A watery or bloody mucus discharge that comes from the vagina. If you have any of these signs before the 37th week of pregnancy, call your caregiver right away. You need to go to the hospital to get checked immediately. HOME CARE INSTRUCTIONS  Avoid all smoking, herbs, alcohol, and unprescribed drugs. These chemicals affect the formation and growth of the baby. Follow your caregiver's instructions regarding medicine use. There are medicines that are either safe or unsafe to take during pregnancy. Exercise only as directed by your caregiver. Experiencing uterine cramps is a good sign to  stop exercising. Continue to eat regular, healthy meals. Wear a good support bra for breast tenderness. Do not use hot tubs, steam rooms, or saunas. Wear your seat belt at all times when driving. Avoid raw meat, uncooked cheese, cat litter boxes, and soil used by cats. These carry germs that can cause birth defects in the baby. Take your prenatal vitamins. Try taking a stool softener (if your caregiver approves) if you develop constipation. Eat more high-fiber foods, such as fresh vegetables or fruit and whole grains. Drink plenty of fluids to keep your urine clear or pale yellow. Take warm sitz baths to soothe any pain or discomfort caused by hemorrhoids. Use hemorrhoid cream if   your caregiver approves. If you develop varicose veins, wear support hose. Elevate your feet for 15 minutes, 3-4 times a day. Limit salt in your diet. Avoid heavy lifting, wear low heal shoes, and practice good posture. Rest a lot with your legs elevated if you have leg cramps or low back pain. Visit your dentist if you have not gone during your pregnancy. Use a soft toothbrush to brush your teeth and be gentle when you floss. A sexual relationship may be continued unless your caregiver directs you otherwise. Do not travel far distances unless it is absolutely necessary and only with the approval of your caregiver. Take prenatal classes to understand, practice, and ask questions about the labor and delivery. Make a trial run to the hospital. Pack your hospital bag. Prepare the baby's nursery. Continue to go to all your prenatal visits as directed by your caregiver. SEEK MEDICAL CARE IF: You are unsure if you are in labor or if your water has broken. You have dizziness. You have mild pelvic cramps, pelvic pressure, or nagging pain in your abdominal area. You have persistent nausea, vomiting, or diarrhea. You have a bad smelling vaginal discharge. You have pain with urination. SEEK IMMEDIATE MEDICAL CARE IF:  You  have a fever. You are leaking fluid from your vagina. You have spotting or bleeding from your vagina. You have severe abdominal cramping or pain. You have rapid weight loss or gain. You have shortness of breath with chest pain. You notice sudden or extreme swelling of your face, hands, ankles, feet, or legs. You have not felt your baby move in over an hour. You have severe headaches that do not go away with medicine. You have vision changes. Document Released: 08/07/2001 Document Revised: 08/18/2013 Document Reviewed: 10/14/2012 ExitCare Patient Information 2015 [employer]. This information is not intended to replace advice given to you by your health care provider. Make sure you discuss any questions you have with your health care provider.       

## 2021-06-05 ENCOUNTER — Other Ambulatory Visit: Payer: Self-pay | Admitting: Women's Health

## 2021-06-05 MED ORDER — HYDROCORTISONE ACETATE 25 MG RE SUPP: 25.0000 mg | Freq: Two times a day (BID) | RECTAL | 0 refills | Status: DC

## 2021-06-05 MED ORDER — ALBUTEROL SULFATE HFA 108 (90 BASE) MCG/ACT IN AERS
2.0000 | INHALATION_SPRAY | Freq: Four times a day (QID) | RESPIRATORY_TRACT | 2 refills | Status: DC | PRN
Start: 2021-06-05 — End: 2021-10-23

## 2021-06-15 ENCOUNTER — Other Ambulatory Visit: Payer: Self-pay

## 2021-06-15 ENCOUNTER — Ambulatory Visit (INDEPENDENT_AMBULATORY_CARE_PROVIDER_SITE_OTHER): Payer: 59 | Admitting: *Deleted

## 2021-06-15 VITALS — BP 127/88 | HR 93 | Wt 237.0 lb

## 2021-06-15 DIAGNOSIS — Z331 Pregnant state, incidental: Secondary | ICD-10-CM

## 2021-06-15 DIAGNOSIS — Z1389 Encounter for screening for other disorder: Secondary | ICD-10-CM

## 2021-06-15 DIAGNOSIS — O09523 Supervision of elderly multigravida, third trimester: Secondary | ICD-10-CM | POA: Diagnosis not present

## 2021-06-15 DIAGNOSIS — O24410 Gestational diabetes mellitus in pregnancy, diet controlled: Secondary | ICD-10-CM

## 2021-06-15 DIAGNOSIS — O0993 Supervision of high risk pregnancy, unspecified, third trimester: Secondary | ICD-10-CM | POA: Diagnosis not present

## 2021-06-15 DIAGNOSIS — O288 Other abnormal findings on antenatal screening of mother: Secondary | ICD-10-CM

## 2021-06-15 LAB — POCT URINALYSIS DIPSTICK OB
Blood, UA: NEGATIVE
Glucose, UA: NEGATIVE
Leukocytes, UA: NEGATIVE
Nitrite, UA: NEGATIVE

## 2021-06-15 NOTE — Progress Notes (Addendum)
   NURSE VISIT- NST  SUBJECTIVE:  Darlene Stanley is a 40 y.o. [phone] female at [redacted]w[redacted]d, here for a NST for pregnancy complicated by AMA and A1DM.  She reports active fetal movement, contractions: none, vaginal bleeding: none, membranes: intact.   OBJECTIVE:  BP 127/88   Pulse 93   Wt 237 lb (107.5 kg)   LMP 10/15/2020   BMI 39.44 kg/m   Appears well, no apparent distress  Results for orders placed or performed in visit on 06/15/21 (from the past 24 hour(s))  POC Urinalysis Dipstick OB   Collection Time: 06/15/21  9:15 AM  Result Value Ref Range   Color, UA     Clarity, UA     Glucose, UA Negative Negative   Bilirubin, UA     Ketones, UA small    Spec Grav, UA     Blood, UA neg    pH, UA     POC,PROTEIN,UA Trace Negative, Trace, Small (1+), Moderate (2+), Large (3+), 4+   Urobilinogen, UA     Nitrite, UA neg    Leukocytes, UA Negative Negative   Appearance     Odor      NST: FHR baseline 135 bpm, Variability: moderate, Accelerations:present, Decelerations:  Absent= Cat 1/reactive Toco: none   ASSESSMENT: [id number] at [redacted]w[redacted]d with AMA and A1DM NST reactive  PLAN: EFM strip reviewed by Dr. Charlotta Newton   Recommendations: keep next appointment as scheduled    Jobe Marker  06/15/2021 9:50 AM  Chart reviewed for nurse visit. Agree with plan of care.  Myna Hidalgo, DO 06/15/2021 3:04 PM

## 2021-06-19 ENCOUNTER — Ambulatory Visit (INDEPENDENT_AMBULATORY_CARE_PROVIDER_SITE_OTHER): Payer: 59

## 2021-06-19 ENCOUNTER — Other Ambulatory Visit: Payer: Self-pay

## 2021-06-19 ENCOUNTER — Other Ambulatory Visit: Payer: Self-pay | Admitting: Advanced Practice Midwife

## 2021-06-19 ENCOUNTER — Other Ambulatory Visit (HOSPITAL_COMMUNITY)
Admission: RE | Admit: 2021-06-19 | Discharge: 2021-06-19 | Disposition: A | Discharge status: Home / Self Care | Payer: 59 | Source: Ambulatory Visit | Attending: Obstetrics & Gynecology | Admitting: Obstetrics & Gynecology

## 2021-06-19 ENCOUNTER — Ambulatory Visit (INDEPENDENT_AMBULATORY_CARE_PROVIDER_SITE_OTHER): Payer: 59 | Admitting: Women's Health

## 2021-06-19 ENCOUNTER — Encounter: Payer: Self-pay | Admitting: Women's Health

## 2021-06-19 VITALS — BP 113/76 | HR 91 | Wt 241.6 lb

## 2021-06-19 DIAGNOSIS — Z3A36 36 weeks gestation of pregnancy: Secondary | ICD-10-CM | POA: Insufficient documentation

## 2021-06-19 DIAGNOSIS — O0993 Supervision of high risk pregnancy, unspecified, third trimester: Secondary | ICD-10-CM

## 2021-06-19 DIAGNOSIS — O24410 Gestational diabetes mellitus in pregnancy, diet controlled: Secondary | ICD-10-CM

## 2021-06-19 DIAGNOSIS — O09523 Supervision of elderly multigravida, third trimester: Secondary | ICD-10-CM

## 2021-06-19 LAB — POCT URINALYSIS DIPSTICK OB
Blood, UA: NEGATIVE
Glucose, UA: NEGATIVE
Ketones, UA: NEGATIVE
Leukocytes, UA: NEGATIVE
Nitrite, UA: NEGATIVE
POC,PROTEIN,UA: NEGATIVE

## 2021-06-19 NOTE — Progress Notes (Signed)
HIGH-RISK PREGNANCY VISIT Patient name: Darlene Stanley MRN [id number]  Date of birth: [birth year] Chief Complaint:   Routine Prenatal Visit, High Risk Gestation, and Pregnancy Ultrasound  History of Present Illness:   Darlene Stanley is a 40 y.o. [id number] female at [redacted]w[redacted]d with an Estimated Date of Delivery: 07/14/21 being seen today for ongoing management of a high-risk pregnancy complicated by advanced maternal age and diabetes mellitus A1DM.    Today she reports  all sugars wnl . Contractions: Irritability. Vag. Bleeding: None.  Movement: Present. denies leaking of fluid.   Depression screen [facility] 2/9 05/03/2021 04/13/2021 01/18/2021 12/02/2020  Decreased Interest 0 1 3 1   Down, Depressed, Hopeless 0 1 2 1   PHQ - 2 Score 0 2 5 2   Altered sleeping - 2 2 2   Tired, decreased energy - 2 3 3   Change in appetite - 1 2 1   Feeling bad or failure about yourself  - 1 2 2   Trouble concentrating - 3 3 1   Moving slowly or fidgety/restless - 0 1 0  Suicidal thoughts - 0 0 0  PHQ-9 Score - 11 18 11      GAD 7 : Generalized Anxiety Score 04/13/2021 01/18/2021 12/02/2020  Nervous, Anxious, on Edge 2 3 2   Control/stop worrying 2 2 1   Worry too much - different things 2 2 1   Trouble relaxing 2 1 1   Restless 2 1 0  Easily annoyed or irritable 0 2 1  Afraid - awful might happen 0 1 0  Total GAD 7 Score 10 12 6      Review of Systems:   Pertinent items are noted in HPI Denies abnormal vaginal discharge w/ itching/odor/irritation, headaches, visual changes, shortness of breath, chest pain, abdominal pain, severe nausea/vomiting, or problems with urination or bowel movements unless otherwise stated above. Pertinent History Reviewed:  Reviewed past medical,surgical, social, obstetrical and family history.  Reviewed problem list, medications and allergies. Physical Assessment:   Vitals:   06/19/21 1624  BP: 113/76  Pulse: 91  Weight: 241 lb 9.6 oz (109.6 kg)  Body mass index is 40.2 kg/m.            Physical Examination:   General appearance: alert, well appearing, and in no distress  Mental status: alert, oriented to person, place, and time  Skin: warm & dry   Extremities: Edema: Trace    Cardiovascular: normal heart rate noted  Respiratory: normal respiratory effort, no distress  Abdomen: gravid, soft, non-tender  Pelvic:  cultures obtained, declined SVE          Fetal Status: Fetal Heart Rate (bpm): 133 u/s   Movement: Present Presentation: Vertex  Fetal Surveillance Testing today: 36+3 wks,cephalic,BPP 8/8,posterior placenta gr 0,FHR 133 bpm,AFI 12.5 cm  Chaperone:    Results for orders placed or performed in visit on 06/19/21 (from the past 24 hour(s))  POC Urinalysis Dipstick OB   Collection Time: 06/19/21  4:27 PM  Result Value Ref Range   Color, UA     Clarity, UA     Glucose, UA Negative Negative   Bilirubin, UA     Ketones, UA neg    Spec Grav, UA     Blood, UA neg    pH, UA     POC,PROTEIN,UA Negative Negative, Trace, Small (1+), Moderate (2+), Large (3+), 4+   Urobilinogen, UA     Nitrite, UA neg    Leukocytes, UA Negative Negative   Appearance     Odor  Assessment & Plan:  High-risk pregnancy: [id number] at [redacted]w[redacted]d with an Estimated Date of Delivery: 07/14/21   1) AMA, stable  2) A1DM, stable, EFW 54% @ 33.5wk  Meds: No orders of the defined types were placed in this encounter.   Labs/procedures today: GBS, GC/CT, and U/S  Treatment Plan:    EFW @ 37wks       2x/wk testing nst/sono @ 36wks        Deliver @ 39-40wks:____   Reviewed: Preterm labor symptoms and general obstetric precautions including but not limited to vaginal bleeding, contractions, leaking of fluid and fetal movement were reviewed in detail with the patient.  All questions were answered. Does have home bp cuff. Office bp cuff given: not applicable. Check bp weekly, let us know if consistently >140 and/or >90.  Follow-up: Return for As scheduled.   Future  Appointments  Date Time Provider Department Center  06/22/2021  2:30 PM [facility] NURSE [facility]  06/26/2021  3:45 PM [facility] [place] [facility] None  06/26/2021  4:30 PM Lazaro Arms, MD [facility]  06/29/2021  3:10 PM [facility] N[facility]  07/03/2021 10:30 AM [facility] [place] [facility] None  07/03/2021 11:30 AM Lazaro Arms, MD [facility]  07/06/2021 10:10 AM [facility] N[facility]  07/10/2021 10:45 AM [facility] [place] [facility] None  07/10/2021 11:30 AM Cheral Marker, CNM [facility]  07/13/2021  9:50 AM [facility] NURSE [facility]    Orders Placed This Encounter  Procedures   Strep Gp B NAA   POC Urinalysis Dipstick OB   Cheral Marker CNM, WHNP-BC 06/19/2021 4:41 PM

## 2021-06-19 NOTE — Progress Notes (Signed)
[place] 36+3 wks,cephalic,BPP 8/8,posterior placenta gr 0,FHR 133 bpm,AFI 12.5 cm

## 2021-06-19 NOTE — Patient Instructions (Signed)
Heena, thank you for choosing our office today! We appreciate the opportunity to meet your healthcare needs. You may receive a short survey by mail, e-mail, or through MyChart. If you are happy with your care we would appreciate if you could take just a few minutes to complete the survey questions. We read all of your comments and take your feedback very seriously. Thank you again for choosing our office.  [facility] ([address] [place], [place] [address]) Entrance C, located off of [address] Free 24/7 valet parking   CLASSES: Go to [email or web address] to register for classes (childbirth, breastfeeding, waterbirth, infant CPR, daddy bootcamp, etc.)  Call the office ([phone]) or go to [facility] if: You begin to have strong, frequent contractions Your water breaks.  Sometimes it is a big gush of fluid, sometimes it is just a trickle that keeps getting your panties wet or running down your legs You have vaginal bleeding.  It is normal to have a small amount of spotting if your cervix was checked.  You don't feel your baby moving like normal.  If you don't, get you something to eat and drink and lay down and focus on feeling your baby move.   If your baby is still not moving like normal, you should call the office or go to [facility].  Call the office ([phone]) or go to [facility] for these signs of pre-eclampsia: Severe headache that does not go away with Tylenol Visual changes- seeing spots, double, blurred vision Pain under your right breast or upper abdomen that does not go away with Tums or heartburn medicine Nausea and/or vomiting Severe swelling in your hands, feet, and face   Tdap Vaccine It is recommended that you get the Tdap vaccine during the third trimester of EACH pregnancy to help protect your baby from getting pertussis (whooping cough) 27-36 weeks is the BEST time to do  this so that you can pass the protection on to your baby. During pregnancy is better than after pregnancy, but if you are unable to get it during pregnancy it will be offered at the hospital.  You can get this vaccine with us, at the health department, your family doctor, or some local pharmacies Everyone who will be around your baby should also be up-to-date on their vaccines before the baby comes. Adults (who are not pregnant) only need 1 dose of Tdap during adulthood.   [place] Pediatricians/Family Doctors [facility] ([facility]): [address], [phone]           [facility]: [address], [phone]                [facility] ([facility]): [address], [phone] (call to ask if accepting patients) [facility]: [address], [place], [phone]    [place] Pediatricians/Family Doctors [facility] ([facility]): [address], [phone] [facility]: [address], [phone] [facility]: [address], [phone]  [place] [facility]  [facility] ([facility]): [phone] [facility]: [address], [phone]   [place] Family Doctors [facility]: [address], [phone]  [place] [facility]  [facility]: [phone], [phone]  Home Blood Pressure Monitoring for Patients   Your provider has recommended that you check your   blood pressure (BP) at least once a week at home. If you do not have a blood pressure cuff at home, one will be provided for you. Contact your provider if you have not received your monitor within 1 week.   Helpful Tips for Accurate Home Blood Pressure Checks  Don't smoke, exercise, or drink caffeine 30 minutes before checking your BP Use the restroom before checking your BP (a full bladder can raise your  pressure) Relax in a comfortable upright chair Feet on the ground Left arm resting comfortably on a flat surface at the level of your heart Legs uncrossed Back supported Sit quietly and don't talk Place the cuff on your bare arm Adjust snuggly, so that only two fingertips can fit between your skin and the top of the cuff Check 2 readings separated by at least one minute Keep a log of your BP readings For a visual, please reference this diagram: http://ccnc.care/bpdiagram  Provider Name: [facility]     Phone: [phone]  Zone 1: ALL CLEAR  Continue to monitor your symptoms:  BP reading is less than 140 (top number) or less than 90 (bottom number)  No right upper stomach pain No headaches or seeing spots No feeling nauseated or throwing up No swelling in face and hands  Zone 2: CAUTION Call your doctor's office for any of the following:  BP reading is greater than 140 (top number) or greater than 90 (bottom number)  Stomach pain under your ribs in the middle or right side Headaches or seeing spots Feeling nauseated or throwing up Swelling in face and hands  Zone 3: EMERGENCY  Seek immediate medical care if you have any of the following:  BP reading is greater than160 (top number) or greater than 110 (bottom number) Severe headaches not improving with Tylenol Serious difficulty catching your breath Any worsening symptoms from Zone 2  Preterm Labor and Birth Information  The normal length of a pregnancy is 39-41 weeks. Preterm labor is when labor starts before 37 completed weeks of pregnancy. What are the risk factors for preterm labor? Preterm labor is more likely to occur in women who: Have certain infections during pregnancy such as a bladder infection, sexually transmitted infection, or infection inside the uterus (chorioamnionitis). Have a shorter-than-normal cervix. Have gone into preterm labor before. Have had surgery on their cervix. Are younger than age 17  or older than age 35. Are African American. Are pregnant with twins or multiple babies (multiple gestation). Take street drugs or smoke while pregnant. Do not gain enough weight while pregnant. Became pregnant shortly after having been pregnant. What are the symptoms of preterm labor? Symptoms of preterm labor include: Cramps similar to those that can happen during a menstrual period. The cramps may happen with diarrhea. Pain in the abdomen or lower back. Regular uterine contractions that may feel like tightening of the abdomen. A feeling of increased pressure in the pelvis. Increased watery or bloody mucus discharge from the vagina. Water breaking (ruptured amniotic sac). Why is it important to recognize signs of preterm labor? It is important to recognize signs of preterm labor because babies who are born prematurely may not be fully developed. This can put them at an increased risk for: Long-term (chronic) heart and lung problems. Difficulty immediately after birth with regulating body systems, including blood sugar, body temperature, heart rate, and breathing rate. Bleeding in the brain. Cerebral palsy. Learning difficulties. Death. These risks are highest for babies who are born before 34 weeks   of pregnancy. How is preterm labor treated? Treatment depends on the length of your pregnancy, your condition, and the health of your baby. It may involve: Having a stitch (suture) placed in your cervix to prevent your cervix from opening too early (cerclage). Taking or being given medicines, such as: Hormone medicines. These may be given early in pregnancy to help support the pregnancy. Medicine to stop contractions. Medicines to help mature the baby's lungs. These may be prescribed if the risk of delivery is high. Medicines to prevent your baby from developing cerebral palsy. If the labor happens before 34 weeks of pregnancy, you may need to stay in the hospital. What should I do if I  think I am in preterm labor? If you think that you are going into preterm labor, call your health care provider right away. How can I prevent preterm labor in future pregnancies? To increase your chance of having a full-term pregnancy: Do not use any tobacco products, such as cigarettes, chewing tobacco, and e-cigarettes. If you need help quitting, ask your health care provider. Do not use street drugs or medicines that have not been prescribed to you during your pregnancy. Talk with your health care provider before taking any herbal supplements, even if you have been taking them regularly. Make sure you gain a healthy amount of weight during your pregnancy. Watch for infection. If you think that you might have an infection, get it checked right away. Make sure to tell your health care provider if you have gone into preterm labor before. This information is not intended to replace advice given to you by your health care provider. Make sure you discuss any questions you have with your health care provider. Document Revised: 12/05/2018 Document Reviewed: 01/04/2016 Elsevier Patient Education  2020 Elsevier Inc.   

## 2021-06-21 LAB — CERVICOVAGINAL ANCILLARY ONLY
Chlamydia: NEGATIVE
Comment: NEGATIVE
Comment: NORMAL
Neisseria Gonorrhea: NEGATIVE

## 2021-06-21 LAB — STREP GP B NAA: Strep Gp B NAA: POSITIVE — AB

## 2021-06-22 ENCOUNTER — Other Ambulatory Visit: Payer: Self-pay

## 2021-06-22 ENCOUNTER — Ambulatory Visit (INDEPENDENT_AMBULATORY_CARE_PROVIDER_SITE_OTHER): Payer: 59 | Admitting: *Deleted

## 2021-06-22 VITALS — BP 119/83 | HR 84 | Wt 242.0 lb

## 2021-06-22 DIAGNOSIS — O24410 Gestational diabetes mellitus in pregnancy, diet controlled: Secondary | ICD-10-CM | POA: Diagnosis not present

## 2021-06-22 DIAGNOSIS — Z331 Pregnant state, incidental: Secondary | ICD-10-CM

## 2021-06-22 DIAGNOSIS — O0993 Supervision of high risk pregnancy, unspecified, third trimester: Secondary | ICD-10-CM

## 2021-06-22 DIAGNOSIS — O288 Other abnormal findings on antenatal screening of mother: Secondary | ICD-10-CM

## 2021-06-22 DIAGNOSIS — Z1389 Encounter for screening for other disorder: Secondary | ICD-10-CM | POA: Diagnosis not present

## 2021-06-22 LAB — POCT URINALYSIS DIPSTICK OB
Blood, UA: NEGATIVE
Glucose, UA: NEGATIVE
Ketones, UA: NEGATIVE
Leukocytes, UA: NEGATIVE
Nitrite, UA: NEGATIVE
POC,PROTEIN,UA: NEGATIVE

## 2021-06-22 NOTE — Progress Notes (Addendum)
   NURSE VISIT- NST  SUBJECTIVE:  Darlene Stanley is a 40 y.o. [phone] female at [redacted]w[redacted]d, here for a NST for pregnancy complicated by A1DM.  She reports active fetal movement, contractions: none, vaginal bleeding: none, membranes: intact.   OBJECTIVE:  BP 119/83   Pulse 84   Wt 242 lb (109.8 kg)   LMP 10/15/2020   BMI 40.27 kg/m   Appears well, no apparent distress  Results for orders placed or performed in visit on 06/22/21 (from the past 24 hour(s))  POC Urinalysis Dipstick OB   Collection Time: 06/22/21  2:45 PM  Result Value Ref Range   Color, UA     Clarity, UA     Glucose, UA Negative Negative   Bilirubin, UA     Ketones, UA neg    Spec Grav, UA     Blood, UA neg    pH, UA     POC,PROTEIN,UA Negative Negative, Trace, Small (1+), Moderate (2+), Large (3+), 4+   Urobilinogen, UA     Nitrite, UA neg    Leukocytes, UA Negative Negative   Appearance     Odor      NST: FHR baseline 130 bpm, Variability: moderate, Accelerations:present, Decelerations:  Absent= Cat 1/reactive Toco: none   ASSESSMENT: [id number] at [redacted]w[redacted]d with A1DM NST reactive  PLAN: EFM strip reviewed by Joellyn Haff, CNM, [facility]   Recommendations: keep next appointment as scheduled    Jobe Marker  06/22/2021 3:25 PM  Chart reviewed for nurse visit. Agree with plan of care.  Cheral Marker, [place] 06/22/2021 3:46 PM

## 2021-06-23 ENCOUNTER — Other Ambulatory Visit: Payer: Self-pay | Admitting: Women's Health

## 2021-06-23 DIAGNOSIS — O24410 Gestational diabetes mellitus in pregnancy, diet controlled: Secondary | ICD-10-CM

## 2021-06-23 DIAGNOSIS — O09523 Supervision of elderly multigravida, third trimester: Secondary | ICD-10-CM

## 2021-06-26 ENCOUNTER — Encounter: Payer: 59 | Admitting: Women's Health

## 2021-06-26 ENCOUNTER — Encounter: Payer: Self-pay | Admitting: Obstetrics & Gynecology

## 2021-06-26 ENCOUNTER — Other Ambulatory Visit: Payer: Self-pay | Admitting: Women's Health

## 2021-06-26 ENCOUNTER — Ambulatory Visit (INDEPENDENT_AMBULATORY_CARE_PROVIDER_SITE_OTHER): Payer: 59

## 2021-06-26 ENCOUNTER — Other Ambulatory Visit: Payer: Self-pay

## 2021-06-26 ENCOUNTER — Ambulatory Visit (INDEPENDENT_AMBULATORY_CARE_PROVIDER_SITE_OTHER): Payer: 59 | Admitting: Obstetrics & Gynecology

## 2021-06-26 ENCOUNTER — Other Ambulatory Visit: Payer: 59

## 2021-06-26 VITALS — BP 125/85 | HR 109 | Wt 239.0 lb

## 2021-06-26 DIAGNOSIS — O24410 Gestational diabetes mellitus in pregnancy, diet controlled: Secondary | ICD-10-CM

## 2021-06-26 DIAGNOSIS — O09523 Supervision of elderly multigravida, third trimester: Secondary | ICD-10-CM

## 2021-06-26 DIAGNOSIS — O0993 Supervision of high risk pregnancy, unspecified, third trimester: Secondary | ICD-10-CM

## 2021-06-26 NOTE — Progress Notes (Signed)
HIGH-RISK PREGNANCY VISIT Patient name: Darlene Stanley MRN [id number]  Date of birth: 1981/04/30 Chief Complaint:   Routine Prenatal Visit (BPP)  History of Present Illness:   Darlene Stanley is a 40 y.o. [id number] female at [redacted]w[redacted]d with an Estimated Date of Delivery: 07/14/21 being seen today for ongoing management of a high-risk pregnancy complicated by advanced maternal age and diabetes mellitus A1DM.    Today she reports no complaints. Contractions: Not present. Vag. Bleeding: None.  Movement: Present. denies leaking of fluid.   Depression screen [facility] 2/9 05/03/2021 04/13/2021 01/18/2021 12/02/2020  Decreased Interest 0 1 3 1   Down, Depressed, Hopeless 0 1 2 1   PHQ - 2 Score 0 2 5 2   Altered sleeping - 2 2 2   Tired, decreased energy - 2 3 3   Change in appetite - 1 2 1   Feeling bad or failure about yourself  - 1 2 2   Trouble concentrating - 3 3 1   Moving slowly or fidgety/restless - 0 1 0  Suicidal thoughts - 0 0 0  PHQ-9 Score - 11 18 11      GAD 7 : Generalized Anxiety Score 04/13/2021 01/18/2021 12/02/2020  Nervous, Anxious, on Edge 2 3 2   Control/stop worrying 2 2 1   Worry too much - different things 2 2 1   Trouble relaxing 2 1 1   Restless 2 1 0  Easily annoyed or irritable 0 2 1  Afraid - awful might happen 0 1 0  Total GAD 7 Score 10 12 6      Review of Systems:   Pertinent items are noted in HPI Denies abnormal vaginal discharge w/ itching/odor/irritation, headaches, visual changes, shortness of breath, chest pain, abdominal pain, severe nausea/vomiting, or problems with urination or bowel movements unless otherwise stated above. Pertinent History Reviewed:  Reviewed past medical,surgical, social, obstetrical and family history.  Reviewed problem list, medications and allergies. Physical Assessment:   Vitals:   06/26/21 1614  BP: 125/85  Pulse: (!) 109  Weight: 239 lb (108.4 kg)  Body mass index is 39.77 kg/m.           Physical Examination:   General appearance: alert,  well appearing, and in no distress  Mental status: alert, oriented to person, place, and time  Skin: warm & dry   Extremities: Edema: Trace    Cardiovascular: normal heart rate noted  Respiratory: normal respiratory effort, no distress  Abdomen: gravid, soft, non-tender  Pelvic: Cervical exam deferred         Fetal Status:     Movement: Present    Fetal Surveillance Testing today: BPP 8/8   Chaperone: n/a  No results found for this or any previous visit (from the past 24 hour(s)).  Assessment & Plan:  High-risk pregnancy: at [redacted]w[redacted]d with an Estimated Date of Delivery: 07/14/21   1) AMA,   2) A1DM, good control  Meds: No orders of the defined types were placed in this encounter.   Labs/procedures today: U/S  Treatment Plan:  IOL 40 weeks,   Reviewed: Term labor symptoms and general obstetric precautions including but not limited to vaginal bleeding, contractions, I will trymy kids avoid alcohol [place] 8 of fluid and fetal movement were reviewed in detail with the patient.  All questions were answered. Does have home bp cuff. Office bp cuff given: not applicable. Check bp daily, let know if consistently >140 and/or >90.  Follow-up: No follow-ups on file.   Future Appointments  Date Time Provider Department Center  06/29/2021  3:10 PM [facility] NURSE [facility]  07/03/2021 10:30 AM [facility] [place] [facility] None  07/03/2021 11:30 AM Lazaro Arms, MD [facility]  07/06/2021 10:10 AM [facility] NUR[facility]  07/10/2021 10:45 AM [facility] [place] [facility] None  07/10/2021 11:30 AM Cheral Marker, CNM [facility]  07/13/2021  9:50 AM [facility] NURSE [facility]    No orders of the defined types were placed in this encounter.  Lazaro Arms  06/26/2021 4:49 PM

## 2021-06-26 NOTE — Progress Notes (Signed)
[place] 37+3 wks,cephalic,BPP 8/8,FHR 152 bpm,posterior placenta gr 2,AFI 12.2 cm,EFW 3158 g 54%

## 2021-06-29 ENCOUNTER — Other Ambulatory Visit: Payer: Self-pay | Admitting: Obstetrics & Gynecology

## 2021-06-29 ENCOUNTER — Ambulatory Visit (INDEPENDENT_AMBULATORY_CARE_PROVIDER_SITE_OTHER): Payer: 59 | Admitting: *Deleted

## 2021-06-29 ENCOUNTER — Other Ambulatory Visit: Payer: Self-pay

## 2021-06-29 VITALS — BP 118/84 | HR 88 | Wt 241.2 lb

## 2021-06-29 DIAGNOSIS — O24410 Gestational diabetes mellitus in pregnancy, diet controlled: Secondary | ICD-10-CM | POA: Diagnosis not present

## 2021-06-29 DIAGNOSIS — Z331 Pregnant state, incidental: Secondary | ICD-10-CM

## 2021-06-29 DIAGNOSIS — O09523 Supervision of elderly multigravida, third trimester: Secondary | ICD-10-CM

## 2021-06-29 DIAGNOSIS — Z3A37 37 weeks gestation of pregnancy: Secondary | ICD-10-CM | POA: Diagnosis not present

## 2021-06-29 DIAGNOSIS — O0993 Supervision of high risk pregnancy, unspecified, third trimester: Secondary | ICD-10-CM | POA: Diagnosis not present

## 2021-06-29 DIAGNOSIS — O288 Other abnormal findings on antenatal screening of mother: Secondary | ICD-10-CM

## 2021-06-29 DIAGNOSIS — Z1389 Encounter for screening for other disorder: Secondary | ICD-10-CM

## 2021-06-29 NOTE — Progress Notes (Addendum)
   NURSE VISIT- NST  SUBJECTIVE:  Darlene Stanley is a 40 y.o. [phone] female at [redacted]w[redacted]d, here for a NST for pregnancy complicated by AMA and A1DM.  She reports active fetal movement, contractions: none, vaginal bleeding: none, membranes: intact.   OBJECTIVE:  BP 118/84   Pulse 88   Wt 241 lb 3.2 oz (109.4 kg)   LMP 10/15/2020   BMI 40.14 kg/m   Appears well, no apparent distress  No results found for this or any previous visit (from the past 24 hour(s)).  NST: FHR baseline 130 bpm, Variability: moderate, Accelerations:present, Decelerations:  Absent= Cat 1/reactive Toco: occasional   ASSESSMENT: [id number] at [redacted]w[redacted]d with AMA and A1DM NST reactive  PLAN: EFM strip reviewed by Dr. Despina Hidden   Recommendations: keep next appointment as scheduled    Jobe Marker  06/29/2021 4:46 PM  Attestation of Attending Supervision of Nursing Visit Encounter: Evaluation and management procedures were performed by the nursing staff under my supervision and collaboration.  I have reviewed the nurse's note and chart, and I agree with the management and plan.  Rockne Coons MD Attending Physician for the Center for [facility] 07/07/2021 10:08 AM  Attestation of Attending Supervision of Nursing Visit Encounter: Evaluation and management procedures were performed by the nursing staff under my supervision and collaboration.  I have reviewed the nurse's note and chart, and I agree with the management and plan.  Rockne Coons MD Attending Physician for the Center for [facility] 07/07/2021 10:08 AM

## 2021-07-03 ENCOUNTER — Encounter: Payer: Self-pay | Admitting: Obstetrics & Gynecology

## 2021-07-03 ENCOUNTER — Ambulatory Visit (INDEPENDENT_AMBULATORY_CARE_PROVIDER_SITE_OTHER): Payer: 59 | Admitting: Obstetrics & Gynecology

## 2021-07-03 ENCOUNTER — Ambulatory Visit (INDEPENDENT_AMBULATORY_CARE_PROVIDER_SITE_OTHER): Payer: 59

## 2021-07-03 ENCOUNTER — Other Ambulatory Visit: Payer: Self-pay

## 2021-07-03 VITALS — BP 123/84 | HR 91 | Wt 242.5 lb

## 2021-07-03 DIAGNOSIS — O09523 Supervision of elderly multigravida, third trimester: Secondary | ICD-10-CM | POA: Diagnosis not present

## 2021-07-03 DIAGNOSIS — O24410 Gestational diabetes mellitus in pregnancy, diet controlled: Secondary | ICD-10-CM

## 2021-07-03 DIAGNOSIS — Z3A38 38 weeks gestation of pregnancy: Secondary | ICD-10-CM

## 2021-07-03 DIAGNOSIS — O0993 Supervision of high risk pregnancy, unspecified, third trimester: Secondary | ICD-10-CM

## 2021-07-03 NOTE — Progress Notes (Signed)
[place] 38+3 wks,cephalic,BPP 8/8,FHR 150 bpm,posterior placenta gr 2,AFI 9.3 cm

## 2021-07-03 NOTE — Progress Notes (Signed)
HIGH-RISK PREGNANCY VISIT Patient name: Darlene Stanley MRN [id number]  Date of birth: [birth year] Chief Complaint:   Routine Prenatal Visit  History of Present Illness:   Darlene Stanley is a 40 y.o. [id number] female at [redacted]w[redacted]d with an Estimated Date of Delivery: 07/14/21 being seen today for ongoing management of a high-risk pregnancy complicated by advanced maternal age and diabetes mellitus A1DM.    Today she reports no complaints. Contractions: Irritability. Vag. Bleeding: None.  Movement: Present. denies leaking of fluid.   Depression screen [facility] 2/9 05/03/2021 04/13/2021 01/18/2021 12/02/2020  Decreased Interest 0 1 3 1   Down, Depressed, Hopeless 0 1 2 1   PHQ - 2 Score 0 2 5 2   Altered sleeping - 2 2 2   Tired, decreased energy - 2 3 3   Change in appetite - 1 2 1   Feeling bad or failure about yourself  - 1 2 2   Trouble concentrating - 3 3 1   Moving slowly or fidgety/restless - 0 1 0  Suicidal thoughts - 0 0 0  PHQ-9 Score - 11 18 11      GAD 7 : Generalized Anxiety Score 04/13/2021 01/18/2021 12/02/2020  Nervous, Anxious, on Edge 2 3 2   Control/stop worrying 2 2 1   Worry too much - different things 2 2 1   Trouble relaxing 2 1 1   Restless 2 1 0  Easily annoyed or irritable 0 2 1  Afraid - awful might happen 0 1 0  Total GAD 7 Score 10 12 6      Review of Systems:   Pertinent items are noted in HPI Denies abnormal vaginal discharge w/ itching/odor/irritation, headaches, visual changes, shortness of breath, chest pain, abdominal pain, severe nausea/vomiting, or problems with urination or bowel movements unless otherwise stated above. Pertinent History Reviewed:  Reviewed past medical,surgical, social, obstetrical and family history.  Reviewed problem list, medications and allergies. Physical Assessment:   Vitals:   07/03/21 1111  BP: 123/84  Pulse: 91  Weight: 242 lb 8 oz (110 kg)  Body mass index is 40.35 kg/m.           Physical Examination:   General appearance: alert, well  appearing, and in no distress  Mental status: alert, oriented to person, place, and time  Skin: warm & dry   Extremities: Edema: None    Cardiovascular: normal heart rate noted  Respiratory: normal respiratory effort, no distress  Abdomen: gravid, soft, non-tender  Pelvic: Cervical exam deferred         Fetal Status:     Movement: Present    Fetal Surveillance Testing today: BPP 8/8   Chaperone: N/A    No results found for this or any previous visit (from the past 24 hour(s)).  Assessment & Plan:  High-risk pregnancy: [id number] at [redacted]w[redacted]d with an Estimated Date of Delivery: 07/14/21   1) A1DM, stable  2) AMA, stable, BPP 8/8  Meds: No orders of the defined types were placed in this encounter.   Labs/procedures today: sonogram  Treatment Plan:  twice weekly surveillance, IOL 40 weeks if undelivered or as clinically indicated  Reviewed: Term labor symptoms and general obstetric precautions including but not limited to vaginal bleeding, contractions, leaking of fluid and fetal movement were reviewed in detail with the patient.  All questions were answered. Does have home bp cuff. Office bp cuff given: not applicable. Check bp daily, let us know if consistently >140 and/or >90.  Follow-up: Return for keep scheduled.   Future Appointments  Date Time Provider [place]  07/06/2021 10:10 AM [facility] NURSE [facility]  07/10/2021 10:45 AM [facility] [place] [facility] None  07/10/2021 11:30 AM Cheral Marker, CNM [facility]  07/13/2021  9:50 AM [facility] NURSE [facility]    No orders of the defined types were placed in this encounter.  Lazaro Arms 07/03/2021 11:35 AM

## 2021-07-06 ENCOUNTER — Ambulatory Visit (INDEPENDENT_AMBULATORY_CARE_PROVIDER_SITE_OTHER): Payer: 59 | Admitting: *Deleted

## 2021-07-06 ENCOUNTER — Other Ambulatory Visit: Payer: Self-pay

## 2021-07-06 VITALS — BP 123/87 | HR 76 | Wt 240.0 lb

## 2021-07-06 DIAGNOSIS — O09523 Supervision of elderly multigravida, third trimester: Secondary | ICD-10-CM | POA: Diagnosis not present

## 2021-07-06 DIAGNOSIS — O24410 Gestational diabetes mellitus in pregnancy, diet controlled: Secondary | ICD-10-CM

## 2021-07-06 DIAGNOSIS — Z331 Pregnant state, incidental: Secondary | ICD-10-CM

## 2021-07-06 DIAGNOSIS — O0993 Supervision of high risk pregnancy, unspecified, third trimester: Secondary | ICD-10-CM

## 2021-07-06 DIAGNOSIS — O288 Other abnormal findings on antenatal screening of mother: Secondary | ICD-10-CM

## 2021-07-06 DIAGNOSIS — Z1389 Encounter for screening for other disorder: Secondary | ICD-10-CM

## 2021-07-06 LAB — POCT URINALYSIS DIPSTICK OB
Blood, UA: NEGATIVE
Glucose, UA: NEGATIVE
Ketones, UA: NEGATIVE
Leukocytes, UA: NEGATIVE
Nitrite, UA: NEGATIVE
POC,PROTEIN,UA: NEGATIVE

## 2021-07-06 NOTE — Progress Notes (Addendum)
   NURSE VISIT- NST  SUBJECTIVE:  Darlene Stanley is a 40 y.o. [phone] female at [redacted]w[redacted]d, here for a NST for pregnancy complicated by AMA and A1DM.  She reports active fetal movement, contractions: none, vaginal bleeding: none, membranes: intact.   OBJECTIVE:  BP 123/87   Pulse 76   Wt 240 lb (108.9 kg)   LMP 10/15/2020   BMI 39.94 kg/m   Appears well, no apparent distress  No results found for this or any previous visit (from the past 24 hour(s)).  NST: FHR baseline 135 bpm, Variability: moderate, Accelerations:present, Decelerations:  Absent= Cat 1/reactive Toco: none   ASSESSMENT: [id number] at [redacted]w[redacted]d with AMA and A1DM NST reactive  PLAN: EFM strip reviewed by Dr. Charlotta Newton   Recommendations: keep next appointment as scheduled    Jobe Marker  07/06/2021 10:40 AM  Chart reviewed for nurse visit. Agree with plan of care.  Myna Hidalgo, DO 07/06/2021 4:48 PM

## 2021-07-07 ENCOUNTER — Other Ambulatory Visit: Payer: Self-pay | Admitting: Obstetrics & Gynecology

## 2021-07-07 DIAGNOSIS — O09523 Supervision of elderly multigravida, third trimester: Secondary | ICD-10-CM

## 2021-07-07 DIAGNOSIS — O24410 Gestational diabetes mellitus in pregnancy, diet controlled: Secondary | ICD-10-CM

## 2021-07-10 ENCOUNTER — Ambulatory Visit (INDEPENDENT_AMBULATORY_CARE_PROVIDER_SITE_OTHER): Payer: 59 | Admitting: Women's Health

## 2021-07-10 ENCOUNTER — Encounter: Payer: Self-pay | Admitting: Women's Health

## 2021-07-10 ENCOUNTER — Other Ambulatory Visit: Payer: Self-pay

## 2021-07-10 ENCOUNTER — Ambulatory Visit (INDEPENDENT_AMBULATORY_CARE_PROVIDER_SITE_OTHER): Payer: 59

## 2021-07-10 VITALS — BP 111/76 | HR 90 | Wt 244.0 lb

## 2021-07-10 DIAGNOSIS — O24410 Gestational diabetes mellitus in pregnancy, diet controlled: Secondary | ICD-10-CM | POA: Diagnosis not present

## 2021-07-10 DIAGNOSIS — Z3A39 39 weeks gestation of pregnancy: Secondary | ICD-10-CM

## 2021-07-10 DIAGNOSIS — O09523 Supervision of elderly multigravida, third trimester: Secondary | ICD-10-CM

## 2021-07-10 DIAGNOSIS — O0993 Supervision of high risk pregnancy, unspecified, third trimester: Secondary | ICD-10-CM

## 2021-07-10 LAB — POCT URINALYSIS DIPSTICK OB
Blood, UA: NEGATIVE
Glucose, UA: NEGATIVE
Ketones, UA: NEGATIVE
Leukocytes, UA: NEGATIVE
Nitrite, UA: NEGATIVE
POC,PROTEIN,UA: NEGATIVE

## 2021-07-10 NOTE — Patient Instructions (Signed)
Darlene Stanley, thank you for choosing our office today! We appreciate the opportunity to meet your healthcare needs. You may receive a short survey by mail, e-mail, or through MyChart. If you are happy with your care we would appreciate if you could take just a few minutes to complete the survey questions. We read all of your comments and take your feedback very seriously. Thank you again for choosing our office.  [facility]  [facility] ([address] [place], [place] [address]) Entrance C, located off of [address] Free 24/7 valet parking   CLASSES: Go to [email or web address] to register for classes (childbirth, breastfeeding, waterbirth, infant CPR, daddy bootcamp, etc.)  Call the office ([phone]) or go to [facility] if: You begin to have strong, frequent contractions Your water breaks.  Sometimes it is a big gush of fluid, sometimes it is just a trickle that keeps getting your panties wet or running down your legs You have vaginal bleeding.  It is normal to have a small amount of spotting if your cervix was checked.  You don't feel your baby moving like normal.  If you don't, get you something to eat and drink and lay down and focus on feeling your baby move.   If your baby is still not moving like normal, you should call the office or go to [facility].  Call the office ([phone]) or go to [facility] for these signs of pre-eclampsia: Severe headache that does not go away with Tylenol Visual changes- seeing spots, double, blurred vision Pain under your right breast or upper abdomen that does not go away with Tums or heartburn medicine Nausea and/or vomiting Severe swelling in your hands, feet, and face   [place] Pediatricians/Family Doctors [facility] ([facility]): [address], [phone]           [facility]: [address], [phone]                  [facility] ([facility]): [address], [phone] (call to ask if accepting patients) [facility]: [address], [place], [phone]    [place] Pediatricians/Family Doctors [facility] ([facility]): [address], [phone] [facility]: [address], [phone] [facility]: [address], [phone]  [place] [facility]  [facility] ([facility]): [phone] [facility]: [address], [phone]   [place] Family [facility]: [address], [phone]  [place] [facility]  [facility]: [address], [phone]  Home Blood Pressure Monitoring for Patients   Your provider has recommended that you check your blood pressure (BP) at least once a week at home. If you do not have a blood pressure cuff at home, one will be provided for you. Contact your provider if you have not received your monitor within 1 week.   Helpful Tips for Accurate Home Blood Pressure Checks  Don't smoke, exercise, or drink caffeine 30 minutes before checking your BP Use the restroom before checking your BP (a full bladder can raise your pressure) Relax in a comfortable upright chair Feet on the ground Left arm resting comfortably on a flat surface at the level of your heart Legs uncrossed Back supported Sit quietly and don't talk Place the cuff on your bare arm Adjust snuggly, so that only two fingertips   can fit between your skin and the top of the cuff Check 2 readings separated by at least one minute Keep a log of your BP readings For a visual, please reference this diagram: http://ccnc.care/bpdiagram  Provider Name: [facility]     Phone: [phone]  Zone 1: ALL CLEAR  Continue to monitor your symptoms:  BP reading is less than 140 (top number) or less than 90 (bottom number)  No right  upper stomach pain No headaches or seeing spots No feeling nauseated or throwing up No swelling in face and hands  Zone 2: CAUTION Call your doctor's office for any of the following:  BP reading is greater than 140 (top number) or greater than 90 (bottom number)  Stomach pain under your ribs in the middle or right side Headaches or seeing spots Feeling nauseated or throwing up Swelling in face and hands  Zone 3: EMERGENCY  Seek immediate medical care if you have any of the following:  BP reading is greater than160 (top number) or greater than 110 (bottom number) Severe headaches not improving with Tylenol Serious difficulty catching your breath Any worsening symptoms from Zone 2   Braxton Hicks Contractions Contractions of the uterus can occur throughout pregnancy, but they are not always a sign that you are in labor. You may have practice contractions called Braxton Hicks contractions. These false labor contractions are sometimes confused with true labor. What are Braxton Hicks contractions? Braxton Hicks contractions are tightening movements that occur in the muscles of the uterus before labor. Unlike true labor contractions, these contractions do not result in opening (dilation) and thinning of the cervix. Toward the end of pregnancy (32-34 weeks), Braxton Hicks contractions can happen more often and may become stronger. These contractions are sometimes difficult to tell apart from true labor because they can be very uncomfortable. You should not feel embarrassed if you go to the hospital with false labor. Sometimes, the only way to tell if you are in true labor is for your health care provider to look for changes in the cervix. The health care provider will do a physical exam and may monitor your contractions. If you are not in true labor, the exam should show that your cervix is not dilating and your water has not broken. If there are no other health problems associated with your  pregnancy, it is completely safe for you to be sent home with false labor. You may continue to have Braxton Hicks contractions until you go into true labor. How to tell the difference between true labor and false labor True labor Contractions last 30-70 seconds. Contractions become very regular. Discomfort is usually felt in the top of the uterus, and it spreads to the lower abdomen and low back. Contractions do not go away with walking. Contractions usually become more intense and increase in frequency. The cervix dilates and gets thinner. False labor Contractions are usually shorter and not as strong as true labor contractions. Contractions are usually irregular. Contractions are often felt in the front of the lower abdomen and in the groin. Contractions may go away when you walk around or change positions while lying down. Contractions get weaker and are shorter-lasting as time goes on. The cervix usually does not dilate or become thin. Follow these instructions at home:  Take over-the-counter and prescription medicines only as told by your health care provider. Keep up with your usual exercises and follow other instructions from your health care provider. Eat and drink lightly if you think   you are going into labor. If Braxton Hicks contractions are making you uncomfortable: Change your position from lying down or resting to walking, or change from walking to resting. Sit and rest in a tub of warm water. Drink enough fluid to keep your urine pale yellow. Dehydration may cause these contractions. Do slow and deep breathing several times an hour. Keep all follow-up prenatal visits as told by your health care provider. This is important. Contact a health care provider if: You have a fever. You have continuous pain in your abdomen. Get help right away if: Your contractions become stronger, more regular, and closer together. You have fluid leaking or gushing from your vagina. You pass  blood-tinged mucus (bloody show). You have bleeding from your vagina. You have low back pain that you never had before. You feel your baby's head pushing down and causing pelvic pressure. Your baby is not moving inside you as much as it used to. Summary Contractions that occur before labor are called Braxton Hicks contractions, false labor, or practice contractions. Braxton Hicks contractions are usually shorter, weaker, farther apart, and less regular than true labor contractions. True labor contractions usually become progressively stronger and regular, and they become more frequent. Manage discomfort from Braxton Hicks contractions by changing position, resting in a warm bath, drinking plenty of water, or practicing deep breathing. This information is not intended to replace advice given to you by your health care provider. Make sure you discuss any questions you have with your health care provider. Document Revised: 07/26/2017 Document Reviewed: 12/27/2016 Elsevier Patient Education  2020 Elsevier Inc.   

## 2021-07-10 NOTE — Progress Notes (Signed)
HIGH-RISK PREGNANCY VISIT Patient name: Darlene Stanley MRN [id number]  Date of birth: 08-23-1981 Chief Complaint:   High Risk Gestation ([place] today)  History of Present Illness:   Darlene Stanley is a 40 y.o. [id number] female at [redacted]w[redacted]d with an Estimated Date of Delivery: 07/14/21 being seen today for ongoing management of a high-risk pregnancy complicated by advanced maternal age and diabetes mellitus A1DM.    Today she reports no complaints. All sugars wnl.  Contractions: Irregular. Vag. Bleeding: None.  Movement: Present. denies leaking of fluid.   Depression screen [facility] 2/9 05/03/2021 04/13/2021 01/18/2021 12/02/2020  Decreased Interest 0 1 3 1   Down, Depressed, Hopeless 0 1 2 1   PHQ - 2 Score 0 2 5 2   Altered sleeping - 2 2 2   Tired, decreased energy - 2 3 3   Change in appetite - 1 2 1   Feeling bad or failure about yourself  - 1 2 2   Trouble concentrating - 3 3 1   Moving slowly or fidgety/restless - 0 1 0  Suicidal thoughts - 0 0 0  PHQ-9 Score - 11 18 11      GAD 7 : Generalized Anxiety Score 04/13/2021 01/18/2021 12/02/2020  Nervous, Anxious, on Edge 2 3 2   Control/stop worrying 2 2 1   Worry too much - different things 2 2 1   Trouble relaxing 2 1 1   Restless 2 1 0  Easily annoyed or irritable 0 2 1  Afraid - awful might happen 0 1 0  Total GAD 7 Score 10 12 6      Review of Systems:   Pertinent items are noted in HPI Denies abnormal vaginal discharge w/ itching/odor/irritation, headaches, visual changes, shortness of breath, chest pain, abdominal pain, severe nausea/vomiting, or problems with urination or bowel movements unless otherwise stated above. Pertinent History Reviewed:  Reviewed past medical,surgical, social, obstetrical and family history.  Reviewed problem list, medications and allergies. Physical Assessment:   Vitals:   07/10/21 1148  BP: 111/76  Pulse: 90  Weight: 244 lb (110.7 kg)  Body mass index is 40.6 kg/m.           Physical Examination:   General  appearance: alert, well appearing, and in no distress  Mental status: alert, oriented to person, place, and time  Skin: warm & dry   Extremities: Edema: None    Cardiovascular: normal heart rate noted  Respiratory: normal respiratory effort, no distress  Abdomen: gravid, soft, non-tender  Pelvic: Cervical exam deferred         Fetal Status: Fetal Heart Rate (bpm): 120   Movement: Present    Fetal Surveillance Testing today: 39+3 wks,cephalic,BPP 8/8,FHR 120 bpm,posterior placenta gr 2,AFI 8.4 cm  Chaperone: N/A    Results for orders placed or performed in visit on 07/10/21 (from the past 24 hour(s))  POC Urinalysis Dipstick OB   Collection Time: 07/10/21 11:32 AM  Result Value Ref Range   Color, UA     Clarity, UA     Glucose, UA Negative Negative   Bilirubin, UA     Ketones, UA neg    Spec Grav, UA     Blood, UA neg    pH, UA     POC,PROTEIN,UA Negative Negative, Trace, Small (1+), Moderate (2+), Large (3+), 4+   Urobilinogen, UA     Nitrite, UA neg    Leukocytes, UA Negative Negative   Appearance     Odor      Assessment & Plan:  High-risk pregnancy: at [redacted]w[redacted]d  with an Estimated Date of Delivery: 07/14/21   1) AMA, stable, BPP 8/8, IOL scheduled for 11/18 AM,  IOL form faxed via Epic and orders placed   2) A1DM, stable  Meds: No orders of the defined types were placed in this encounter.   Labs/procedures today: U/S  Treatment Plan:  IOL 11/18  Reviewed: Preterm labor symptoms and general obstetric precautions including but not limited to vaginal bleeding, contractions, leaking of fluid and fetal movement were reviewed in detail with the patient.  All questions were answered. Does have home bp cuff. Office bp cuff given: not applicable. Check bp weekly, let us know if consistently >140 and/or >90.  Follow-up: Return for As scheduled Thurs nst/nurse.   Future Appointments  Date Time Provider [place]  07/13/2021  9:50 AM [facility] NURSE  [facility]  07/14/2021  7:45 AM [facility] SCHED ROOM [facility] None    Orders Placed This Encounter  Procedures   POC Urinalysis Dipstick OB   Roma Schanz CNM, [facility] 07/10/2021 12:11 PM

## 2021-07-10 NOTE — Progress Notes (Signed)
[place] 39+3 wks,cephalic,BPP 8/8,FHR 120 bpm,posterior placenta gr 2,AFI 8.4 cm

## 2021-07-11 ENCOUNTER — Other Ambulatory Visit: Payer: Self-pay | Admitting: Advanced Practice Midwife

## 2021-07-13 ENCOUNTER — Other Ambulatory Visit: Payer: Self-pay

## 2021-07-13 ENCOUNTER — Ambulatory Visit (INDEPENDENT_AMBULATORY_CARE_PROVIDER_SITE_OTHER): Payer: 59 | Admitting: *Deleted

## 2021-07-13 VITALS — BP 134/91 | HR 76 | Wt 245.0 lb

## 2021-07-13 DIAGNOSIS — Z1389 Encounter for screening for other disorder: Secondary | ICD-10-CM | POA: Diagnosis not present

## 2021-07-13 DIAGNOSIS — O24410 Gestational diabetes mellitus in pregnancy, diet controlled: Secondary | ICD-10-CM | POA: Diagnosis not present

## 2021-07-13 DIAGNOSIS — O0993 Supervision of high risk pregnancy, unspecified, third trimester: Secondary | ICD-10-CM | POA: Diagnosis not present

## 2021-07-13 DIAGNOSIS — O288 Other abnormal findings on antenatal screening of mother: Secondary | ICD-10-CM

## 2021-07-13 DIAGNOSIS — Z331 Pregnant state, incidental: Secondary | ICD-10-CM

## 2021-07-13 LAB — POCT URINALYSIS DIPSTICK OB
Blood, UA: NEGATIVE
Glucose, UA: NEGATIVE
Ketones, UA: NEGATIVE
Leukocytes, UA: NEGATIVE
Nitrite, UA: NEGATIVE
POC,PROTEIN,UA: NEGATIVE

## 2021-07-13 NOTE — Progress Notes (Addendum)
   NURSE VISIT- NST  SUBJECTIVE:  Darlene Stanley is a 40 y.o. [phone] female at [redacted]w[redacted]d, here for a NST for pregnancy complicated by A1DM.  She reports active fetal movement, contractions: none, vaginal bleeding: none, membranes: intact.   OBJECTIVE:  BP (!) 134/91   Pulse 76   Wt 245 lb (111.1 kg)   LMP 10/15/2020   BMI 40.77 kg/m   Appears well, no apparent distress  Results for orders placed or performed in visit on 07/13/21 (from the past 24 hour(s))  POC Urinalysis Dipstick OB   Collection Time: 07/13/21 10:20 AM  Result Value Ref Range   Color, UA     Clarity, UA     Glucose, UA Negative Negative   Bilirubin, UA     Ketones, UA neg    Spec Grav, UA     Blood, UA neg    pH, UA     POC,PROTEIN,UA Negative Negative, Trace, Small (1+), Moderate (2+), Large (3+), 4+   Urobilinogen, UA     Nitrite, UA neg    Leukocytes, UA Negative Negative   Appearance     Odor      NST: FHR baseline 130 bpm, Variability: moderate, Accelerations:present, Decelerations:  Absent= Cat 1/reactive Toco: occasional   ASSESSMENT: [id number] at [redacted]w[redacted]d with A1DM NST reactive  PLAN: EFM strip reviewed by Dr. Despina Hidden   Recommendations: keep next appointment as scheduled    Jobe Marker  07/13/2021 10:35 AM   Attestation of Attending Supervision of Nursing Visit Encounter: Evaluation and management procedures were performed by the nursing staff under my supervision and collaboration.  I have reviewed the nurse's note and chart, and I agree with the management and plan.  Rockne Coons MD Attending Physician for the [facility] 07/16/2021 1:18 PM

## 2021-07-14 ENCOUNTER — Inpatient Hospital Stay (HOSPITAL_COMMUNITY): Admission: AD | Admit: 2021-07-14 | Payer: 59 | Source: Home / Self Care | Admitting: Obstetrics & Gynecology

## 2021-07-14 ENCOUNTER — Encounter (HOSPITAL_COMMUNITY): Payer: Self-pay | Admitting: Obstetrics & Gynecology

## 2021-07-14 ENCOUNTER — Inpatient Hospital Stay (HOSPITAL_COMMUNITY): Payer: 59

## 2021-07-14 ENCOUNTER — Inpatient Hospital Stay (HOSPITAL_COMMUNITY)
Admission: AD | Admit: 2021-07-14 | Discharge: 2021-07-16 | DRG: 806 | Disposition: A | Discharge status: Home / Self Care | Payer: 59 | Attending: Obstetrics and Gynecology | Admitting: Obstetrics and Gynecology

## 2021-07-14 ENCOUNTER — Other Ambulatory Visit: Payer: Self-pay

## 2021-07-14 DIAGNOSIS — O99824 Streptococcus B carrier state complicating childbirth: Secondary | ICD-10-CM | POA: Diagnosis present

## 2021-07-14 DIAGNOSIS — O9952 Diseases of the respiratory system complicating childbirth: Secondary | ICD-10-CM | POA: Diagnosis present

## 2021-07-14 DIAGNOSIS — O24420 Gestational diabetes mellitus in childbirth, diet controlled: Secondary | ICD-10-CM | POA: Diagnosis present

## 2021-07-14 DIAGNOSIS — Z20822 Contact with and (suspected) exposure to covid-19: Secondary | ICD-10-CM | POA: Diagnosis present

## 2021-07-14 DIAGNOSIS — Z3A40 40 weeks gestation of pregnancy: Secondary | ICD-10-CM

## 2021-07-14 DIAGNOSIS — F1290 Cannabis use, unspecified, uncomplicated: Secondary | ICD-10-CM | POA: Diagnosis present

## 2021-07-14 DIAGNOSIS — O24410 Gestational diabetes mellitus in pregnancy, diet controlled: Secondary | ICD-10-CM

## 2021-07-14 DIAGNOSIS — O43193 Other malformation of placenta, third trimester: Secondary | ICD-10-CM | POA: Diagnosis present

## 2021-07-14 DIAGNOSIS — O99324 Drug use complicating childbirth: Secondary | ICD-10-CM | POA: Diagnosis present

## 2021-07-14 DIAGNOSIS — J449 Chronic obstructive pulmonary disease, unspecified: Secondary | ICD-10-CM | POA: Diagnosis present

## 2021-07-14 DIAGNOSIS — O0993 Supervision of high risk pregnancy, unspecified, third trimester: Secondary | ICD-10-CM

## 2021-07-14 DIAGNOSIS — O09529 Supervision of elderly multigravida, unspecified trimester: Secondary | ICD-10-CM

## 2021-07-14 DIAGNOSIS — R768 Other specified abnormal immunological findings in serum: Secondary | ICD-10-CM | POA: Diagnosis present

## 2021-07-14 DIAGNOSIS — Z87891 Personal history of nicotine dependence: Secondary | ICD-10-CM

## 2021-07-14 DIAGNOSIS — Z23 Encounter for immunization: Secondary | ICD-10-CM | POA: Diagnosis not present

## 2021-07-14 DIAGNOSIS — R7689 Other specified abnormal immunological findings in serum: Secondary | ICD-10-CM | POA: Diagnosis present

## 2021-07-14 DIAGNOSIS — Z7982 Long term (current) use of aspirin: Secondary | ICD-10-CM | POA: Diagnosis not present

## 2021-07-14 LAB — RAPID URINE DRUG SCREEN, HOSP PERFORMED
Amphetamines: NOT DETECTED
Barbiturates: NOT DETECTED
Benzodiazepines: NOT DETECTED
Cocaine: NOT DETECTED
Opiates: NOT DETECTED
Tetrahydrocannabinol: POSITIVE — AB

## 2021-07-14 LAB — CBC
HCT: 37.5 % (ref 36.0–46.0)
Hemoglobin: 12.6 g/dL (ref 12.0–15.0)
MCH: 28.9 pg (ref 26.0–34.0)
MCHC: 33.6 g/dL (ref 30.0–36.0)
MCV: 86 fL (ref 80.0–100.0)
Platelets: 267 10*3/uL (ref 150–400)
RBC: 4.36 MIL/uL (ref 3.87–5.11)
RDW: 14.1 % (ref 11.5–15.5)
WBC: 9.1 10*3/uL (ref 4.0–10.5)
nRBC: 0 % (ref 0.0–0.2)

## 2021-07-14 LAB — RESP PANEL BY RT-PCR (FLU A&B, COVID) ARPGX2
Influenza A by PCR: NEGATIVE
Influenza B by PCR: NEGATIVE
SARS Coronavirus 2 by RT PCR: NEGATIVE

## 2021-07-14 LAB — GLUCOSE, CAPILLARY: Glucose-Capillary: 78 mg/dL (ref 70–99)

## 2021-07-14 MED ORDER — OXYCODONE-ACETAMINOPHEN 5-325 MG PO TABS
2.0000 | ORAL_TABLET | ORAL | Status: DC | PRN
  Administered 2021-07-15: 2 via ORAL
  Filled 2021-07-14: qty 2

## 2021-07-14 MED ORDER — OXYTOCIN BOLUS FROM INFUSION
333.0000 mL | Freq: Once | INTRAVENOUS | Status: AC
  Administered 2021-07-15: 333 mL via INTRAVENOUS

## 2021-07-14 MED ORDER — TERBUTALINE SULFATE 1 MG/ML IJ SOLN
0.2500 mg | Freq: Once | INTRAMUSCULAR | Status: AC | PRN
  Administered 2021-07-14: 0.25 mg via SUBCUTANEOUS
  Filled 2021-07-14: qty 1

## 2021-07-14 MED ORDER — TERBUTALINE SULFATE 1 MG/ML IJ SOLN
0.2500 mg | Freq: Once | INTRAMUSCULAR | Status: AC | PRN
  Administered 2021-07-15: 0.25 mg via SUBCUTANEOUS
  Filled 2021-07-14: qty 1

## 2021-07-14 MED ORDER — OXYCODONE-ACETAMINOPHEN 5-325 MG PO TABS: 1.0000 | ORAL_TABLET | ORAL | Status: DC | PRN

## 2021-07-14 MED ORDER — SODIUM CHLORIDE 0.9 % IV SOLN
5.0000 10*6.[IU] | Freq: Once | INTRAVENOUS | Status: AC
  Administered 2021-07-14: 5 10*6.[IU] via INTRAVENOUS
  Filled 2021-07-14: qty 5

## 2021-07-14 MED ORDER — LACTATED RINGERS IV SOLN: INTRAVENOUS | Status: DC

## 2021-07-14 MED ORDER — ACETAMINOPHEN 325 MG PO TABS: 650.0000 mg | ORAL_TABLET | ORAL | Status: DC | PRN

## 2021-07-14 MED ORDER — HYDROXYZINE HCL 50 MG PO TABS: 50.0000 mg | ORAL_TABLET | Freq: Four times a day (QID) | ORAL | Status: DC | PRN

## 2021-07-14 MED ORDER — FENTANYL CITRATE (PF) 100 MCG/2ML IJ SOLN
100.0000 ug | INTRAMUSCULAR | Status: DC | PRN
  Administered 2021-07-15: 100 ug via INTRAVENOUS
  Filled 2021-07-14: qty 2

## 2021-07-14 MED ORDER — OXYTOCIN-SODIUM CHLORIDE 30-0.9 UT/500ML-% IV SOLN
1.0000 m[IU]/min | INTRAVENOUS | Status: DC
Start: 2021-07-14 — End: 2021-07-15
  Administered 2021-07-14: 1 m[IU]/min via INTRAVENOUS
  Filled 2021-07-14: qty 500

## 2021-07-14 MED ORDER — PENICILLIN G POT IN DEXTROSE 60000 UNIT/ML IV SOLN
3.0000 10*6.[IU] | INTRAVENOUS | Status: DC
  Administered 2021-07-14 – 2021-07-15 (×2): 3 10*6.[IU] via INTRAVENOUS
  Filled 2021-07-14 (×2): qty 50

## 2021-07-14 MED ORDER — LACTATED RINGERS IV SOLN: 500.0000 mL | INTRAVENOUS | Status: DC | PRN

## 2021-07-14 MED ORDER — MISOPROSTOL 25 MCG QUARTER TABLET
25.0000 ug | ORAL_TABLET | ORAL | Status: DC | PRN
  Administered 2021-07-14: 25 ug via VAGINAL
  Filled 2021-07-14: qty 1

## 2021-07-14 MED ORDER — OXYTOCIN-SODIUM CHLORIDE 30-0.9 UT/500ML-% IV SOLN: 2.5000 [IU]/h | INTRAVENOUS | Status: DC

## 2021-07-14 MED ORDER — FLEET ENEMA 7-19 GM/118ML RE ENEM: 1.0000 | ENEMA | RECTAL | Status: DC | PRN

## 2021-07-14 MED ORDER — LIDOCAINE HCL (PF) 1 % IJ SOLN
30.0000 mL | INTRAMUSCULAR | Status: AC | PRN
  Administered 2021-07-15: 30 mL via SUBCUTANEOUS
  Filled 2021-07-14: qty 30

## 2021-07-14 MED ORDER — SOD CITRATE-CITRIC ACID 500-334 MG/5ML PO SOLN: 30.0000 mL | ORAL | Status: DC | PRN

## 2021-07-14 MED ORDER — ONDANSETRON HCL 4 MG/2ML IJ SOLN
4.0000 mg | Freq: Four times a day (QID) | INTRAMUSCULAR | Status: DC | PRN
  Administered 2021-07-15: 4 mg via INTRAVENOUS
  Filled 2021-07-14: qty 2

## 2021-07-14 NOTE — Progress Notes (Signed)
Labor Progress Note Juliyah Mergen is a 40 y.o. [id number] at [redacted]w[redacted]d presented for IOL due to A1GDM.   S: Doing well, having an occasional contraction in her back. She had two additional FHR decelerations (about 2 minutes each) that resolve with position changes.   O:  BP 132/66   Pulse (!) 102   Temp 98.5 F (36.9 C) (Oral)   Resp 18   Ht 5\' 5"  (1.651 m)   Wt 111.1 kg   LMP 10/15/2020   BMI 40.77 kg/m  EFM: 130/mod /none/none currently   CVE: Dilation: 3 Effacement (%): 70 Station: Ballotable Presentation: Vertex Exam by:: Dr. [email or web address]   A&P: 40 y.o. 45 [redacted]w[redacted]d  #Labor: Some progression since last check and would likely not benefit from FB placement anymore. Discussed plan of care with Dr. [redacted]w[redacted]d, plan to start pit 1x1 if FHT remains reassuring. Hopeful for AROM as station improves. Encouraged position changes and walking as to hopefully improve fetal positioning.  #Pain: PRN  #FWB: Cat I currently, also has been Cat II with recent terb, now resolves with position changes.  #GBS positive PCN   #A1GDM: CBG 78. Cont to monitor. EFW 54% at 37 weeks.   Donavan Foil, DO 10:48 PM
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
## 2021-07-14 NOTE — Progress Notes (Addendum)
Patient ID: Darlene Stanley, female   DOB: 01-May-1981, 40 y.o.   MRN: [id number]  CNM at bedside to assess prolonged decels. Patient in hands and knees. Cervix unchanged from admission, remote from delivery. Reassuring improvement in fetal status. Dr. Donavan Foil notified. Will give Terb. CNM discussed with patient concern for labor status vs quality of fetal tracing. Patient verbalized understanding. Will continue to monitor  Patient has been NPO since 1800  [name], MSA, MSN, CNM Certified Nurse Midwife, [employer] for [employer], [facility]

## 2021-07-15 ENCOUNTER — Encounter (HOSPITAL_COMMUNITY): Payer: Self-pay | Admitting: Obstetrics & Gynecology

## 2021-07-15 DIAGNOSIS — O24420 Gestational diabetes mellitus in childbirth, diet controlled: Secondary | ICD-10-CM

## 2021-07-15 DIAGNOSIS — Z3A40 40 weeks gestation of pregnancy: Secondary | ICD-10-CM

## 2021-07-15 DIAGNOSIS — O99824 Streptococcus B carrier state complicating childbirth: Secondary | ICD-10-CM

## 2021-07-15 LAB — GLUCOSE, CAPILLARY
Glucose-Capillary: 105 mg/dL — ABNORMAL HIGH (ref 70–99)
Glucose-Capillary: 78 mg/dL (ref 70–99)

## 2021-07-15 LAB — RPR: RPR Ser Ql: NONREACTIVE

## 2021-07-15 MED ORDER — PHENYLEPHRINE 40 MCG/ML (10ML) SYRINGE FOR IV PUSH (FOR BLOOD PRESSURE SUPPORT): 80.0000 ug | PREFILLED_SYRINGE | INTRAVENOUS | Status: DC | PRN

## 2021-07-15 MED ORDER — SENNOSIDES-DOCUSATE SODIUM 8.6-50 MG PO TABS
2.0000 | ORAL_TABLET | Freq: Every day | ORAL | Status: DC
  Administered 2021-07-15 – 2021-07-16 (×2): 2 via ORAL
  Filled 2021-07-15 (×2): qty 2

## 2021-07-15 MED ORDER — ALBUTEROL SULFATE (2.5 MG/3ML) 0.083% IN NEBU: 2.5000 mg | INHALATION_SOLUTION | Freq: Four times a day (QID) | RESPIRATORY_TRACT | Status: DC | PRN

## 2021-07-15 MED ORDER — LACTATED RINGERS IV SOLN: 500.0000 mL | Freq: Once | INTRAVENOUS | Status: DC

## 2021-07-15 MED ORDER — MEDROXYPROGESTERONE ACETATE 150 MG/ML IM SUSP: 150.0000 mg | INTRAMUSCULAR | Status: DC | PRN

## 2021-07-15 MED ORDER — OXYCODONE-ACETAMINOPHEN 5-325 MG PO TABS
1.0000 | ORAL_TABLET | ORAL | Status: DC | PRN
Start: 2021-07-15 — End: 2021-07-16
  Administered 2021-07-15 – 2021-07-16 (×5): 2 via ORAL
  Filled 2021-07-15 (×5): qty 2

## 2021-07-15 MED ORDER — WITCH HAZEL-GLYCERIN EX PADS: 1.0000 "application " | MEDICATED_PAD | CUTANEOUS | Status: DC | PRN

## 2021-07-15 MED ORDER — FENTANYL-BUPIVACAINE-NACL 0.5-0.125-0.9 MG/250ML-% EP SOLN
12.0000 mL/h | EPIDURAL | Status: DC | PRN
  Filled 2021-07-15: qty 250

## 2021-07-15 MED ORDER — MEASLES, MUMPS & RUBELLA VAC IJ SOLR
0.5000 mL | Freq: Once | INTRAMUSCULAR | Status: AC
  Administered 2021-07-16: 0.5 mL via SUBCUTANEOUS
  Filled 2021-07-15: qty 0.5

## 2021-07-15 MED ORDER — DIPHENHYDRAMINE HCL 50 MG/ML IJ SOLN: 12.5000 mg | INTRAMUSCULAR | Status: DC | PRN

## 2021-07-15 MED ORDER — MOMETASONE FURO-FORMOTEROL FUM 200-5 MCG/ACT IN AERO
2.0000 | INHALATION_SPRAY | Freq: Two times a day (BID) | RESPIRATORY_TRACT | Status: DC
  Administered 2021-07-15 – 2021-07-16 (×3): 2 via RESPIRATORY_TRACT
  Filled 2021-07-15: qty 8.8

## 2021-07-15 MED ORDER — COCONUT OIL OIL
1.0000 "application " | TOPICAL_OIL | Status: DC | PRN
  Administered 2021-07-15: 1 via TOPICAL

## 2021-07-15 MED ORDER — ONDANSETRON HCL 4 MG/2ML IJ SOLN: 4.0000 mg | INTRAMUSCULAR | Status: DC | PRN

## 2021-07-15 MED ORDER — IBUPROFEN 600 MG PO TABS
600.0000 mg | ORAL_TABLET | Freq: Four times a day (QID) | ORAL | Status: DC
  Administered 2021-07-15 – 2021-07-16 (×5): 600 mg via ORAL
  Filled 2021-07-15 (×5): qty 1

## 2021-07-15 MED ORDER — OXYTOCIN-SODIUM CHLORIDE 30-0.9 UT/500ML-% IV SOLN
1.0000 m[IU]/min | INTRAVENOUS | Status: DC
Start: 2021-07-15 — End: 2021-07-15

## 2021-07-15 MED ORDER — PRENATAL MULTIVITAMIN CH
1.0000 | ORAL_TABLET | Freq: Every day | ORAL | Status: DC
  Administered 2021-07-15 – 2021-07-16 (×2): 1 via ORAL
  Filled 2021-07-15 (×2): qty 1

## 2021-07-15 MED ORDER — DIBUCAINE (PERIANAL) 1 % EX OINT: 1.0000 "application " | TOPICAL_OINTMENT | CUTANEOUS | Status: DC | PRN

## 2021-07-15 MED ORDER — TETANUS-DIPHTH-ACELL PERTUSSIS 5-2.5-18.5 LF-MCG/0.5 IM SUSY: 0.5000 mL | PREFILLED_SYRINGE | Freq: Once | INTRAMUSCULAR | Status: DC

## 2021-07-15 MED ORDER — ACETAMINOPHEN 325 MG PO TABS: 650.0000 mg | ORAL_TABLET | ORAL | Status: DC | PRN

## 2021-07-15 MED ORDER — DIPHENHYDRAMINE HCL 25 MG PO CAPS: 25.0000 mg | ORAL_CAPSULE | Freq: Four times a day (QID) | ORAL | Status: DC | PRN

## 2021-07-15 MED ORDER — EPHEDRINE 5 MG/ML INJ: 10.0000 mg | INTRAVENOUS | Status: DC | PRN

## 2021-07-15 MED ORDER — ONDANSETRON HCL 4 MG PO TABS: 4.0000 mg | ORAL_TABLET | ORAL | Status: DC | PRN

## 2021-07-15 MED ORDER — SIMETHICONE 80 MG PO CHEW: 80.0000 mg | CHEWABLE_TABLET | ORAL | Status: DC | PRN

## 2021-07-15 MED ORDER — ZOLPIDEM TARTRATE 5 MG PO TABS: 5.0000 mg | ORAL_TABLET | Freq: Every evening | ORAL | Status: DC | PRN

## 2021-07-15 MED ORDER — BENZOCAINE-MENTHOL 20-0.5 % EX AERO
1.0000 "application " | INHALATION_SPRAY | CUTANEOUS | Status: DC | PRN
  Administered 2021-07-15: 1 via TOPICAL
  Filled 2021-07-15: qty 56

## 2021-07-15 NOTE — Progress Notes (Signed)
Labor Progress Note Darlene Stanley is a 40 y.o. [id number] at [redacted]w[redacted]d presented for IOL due to A1GDM.   S: Feeling contractions stronger, breathing through them.   O:  BP 113/78   Pulse 80   Temp 97.8 F (36.6 C)   Resp 18   Ht 5\' 5"  (1.651 m)   Wt 111.1 kg   LMP 10/15/2020   BMI 40.77 kg/m  EFM: 130/mod/15x15/none  CVE: Dilation: 4 Effacement (%): 80 Station: -3 Presentation: Vertex Exam by:: [email or web address], RN   A&P: 40 y.o. 45 [redacted]w[redacted]d  #Labor: Some progression since last check. Plan to allow cervical station to improve with AROM on next check if favorable.  #Pain: PRN, just received IV fent  #FWB: Cat 1  #GBS positive PCN  [redacted]w[redacted]d, DO 3:50 AM

## 2021-07-15 NOTE — Progress Notes (Signed)
Called by RN to evaluate uterine bleeding postpartum. EBL with delivery. She just had a small clot expressed with some oozing remaining.   After verbal consent, performed a brief lower uterine sweep with a small amount of clots retrieved. No significant build up palpated. Fundus firm at umbilicus/1 under. Patient then went to the restroom and urinated. Afterwards re-performed fundal massage with minimal bleeding. Total EBL now 432. Will continue to monitor.   Allayne Stack, DO

## 2021-07-15 NOTE — Discharge Summary (Signed)
Postpartum Discharge Summary  Date of Service updated 07/16/2021      Patient Name: Darlene Stanley DOB: [birth year] MRN: [id number]  Date of admission: 07/14/2021 Delivery date:07/15/2021  Delivering provider: Janyth Pupa  Date of discharge: 07/16/2021   Admitting diagnosis: AMA (advanced maternal age) multigravida 42+ [O09.529] Intrauterine pregnancy: [redacted]w[redacted]d    Secondary diagnosis:  Principal Problem:   AMA (advanced maternal age) multigravida 35+ Active Problems:   Jka antibody positive  Additional problems: Fetal heart rate decelerations requiring emergent vacuum assisted vaginal birth    Discharge diagnosis: Term Pregnancy Delivered, GDM A1, and labile hypertension                                               Post partum procedures: no Augmentation: AROM and Cytotec Complications: None  Hospital course: Induction of Labor With Vaginal Delivery   40y.o. yo [id number] 493w1das admitted to the hospital 07/14/2021 for induction of labor.  Indication for induction: A1 DM.  Patient had an uncomplicated labor course as follows: Membrane Rupture Time/Date: 4:38 AM ,07/15/2021   Delivery Method:Vaginal, Vacuum (Extractor)  FHR with deep decels in second stage.  Required emergent Vacuum assisted delivery, [place] placenta, with small area (0.5cm) of vessels traversing between placenta and cord Episiotomy: None  Lacerations:  Perineal;2nd degree  Details of delivery can be found in separate delivery note.  Patient had a routine postpartum course. Patient is discharged home 07/16/21.  Newborn Data: Birth date:07/15/2021  Birth time:4:49 AM  Gender:Female  Living status:Living  Apgars:9 ,9  Weight:3420 g   Magnesium Sulfate received: No BMZ received: No Rhophylac:N/A MMR:No T-DaP:Given prenatally Flu: N/A Transfusion:No  Physical exam  Vitals:   07/15/21 1951 07/16/21 0159 07/16/21 0810 07/16/21 0820  BP: 126/75 134/82 (!) 134/92 114/88  Pulse: 75 76 71 73   Resp: _0 Temp: (!) 97.5 F (36.4 C) 97.9 F (36.6 C) 97.7 F (36.5 C)   TempSrc: Oral Oral Oral   SpO2: 97% 98% 99%   Weight:      Height:       General: alert, cooperative, and no distress Lochia: appropriate Uterine Fundus: firm Incision: N/A DVT Evaluation: No evidence of DVT seen on physical exam. Labs: Lab Results  Component Value Date   WBC 9.1 07/14/2021   HGB 12.6 07/14/2021   HCT 37.5 07/14/2021   MCV 86.0 07/14/2021   PLT 267 07/14/2021   CMP Latest Ref Rng & Units 01/18/2021  Glucose 65 - 99 mg/dL 89  BUN 6 - 24 mg/dL 6  Creatinine 0.57 - 1.00 mg/dL 0.48(L)  Sodium 134 - 144 mmol/L 134  Potassium 3.5 - 5.2 mmol/L 3.8  Chloride 96 - 106 mmol/L 99  CO2 20 - 29 mmol/L 18(L)  Calcium 8.7 - 10.2 mg/dL 9.4  Total Protein 6.0 - 8.5 g/dL 7.4  Total Bilirubin 0.0 - 1.2 mg/dL 0.4  Alkaline Phos 44 - 121 IU/L 61  AST 0 - 40 IU/L 20  ALT 0 - 32 IU/L 30   Edinburgh Score: Edinburgh Postnatal Depression Scale Screening Tool 07/15/2021  I have been able to laugh and see the funny side of things. 1  I have looked forward with enjoyment to things. 0  I have blamed myself unnecessarily when things went wrong. 2  I have been anxious or worried for no  good reason. 3  I have felt scared or panicky for no good reason. 2  Things have been getting on top of me. 2  I have been so unhappy that I have had difficulty sleeping. 1  I have felt sad or miserable. 2  I have been so unhappy that I have been crying. 2  The thought of harming myself has occurred to me. 0  Edinburgh Postnatal Depression Scale Total 15      After visit meds:  Allergies as of 07/16/2021   No Known Allergies      Medication List     STOP taking these medications    Accu-Chek Guide Me w/Device Kit   aspirin 81 MG EC tablet   glucose blood test strip   onetouch ultrasoft lancets   OneTouch Verio w/Device Kit   TUMS PO   UNABLE TO FIND       TAKE these medications     albuterol 108 (90 Base) MCG/ACT inhaler Commonly known as: VENTOLIN HFA Inhale 2 puffs into the lungs every 6 (six) hours as needed for wheezing or shortness of breath.   budesonide-formoterol 160-4.5 MCG/ACT inhaler Commonly known as: SYMBICORT Inhale 2 puffs into the lungs 2 (two) times daily.   ibuprofen 600 MG tablet Commonly known as: ADVIL Take 1 tablet (600 mg total) by mouth every 6 (six) hours.   PRENATAL VITAMIN PO Take by mouth.   VITAMIN B-12 PO Take by mouth.         Discharge home in stable condition Infant Feeding: Breast Infant Disposition:rooming in Discharge instruction: per After Visit Summary and Postpartum booklet. Activity: Advance as tolerated. Pelvic rest for 6 weeks.  Diet: routine diet Anticipated Birth Control: POPs Postpartum Appointment:4 weeks Additional Postpartum F/U: BP check 1 week Future Appointments:No future appointments. Follow up Visit:  Follow-up Information     [facility] Follow up in 1 week(s).   Specialty: Obstetrics and Gynecology Why: BP check Contact information: [place] [address] [phone]                    07/16/2021 Emeterio Reeve, MD

## 2021-07-15 NOTE — Lactation Note (Addendum)
This note was copied from a baby's chart. Lactation Consultation Note  Patient Name: Darlene Stanley QASTM'H Date: 07/15/2021 Reason for consult: Initial assessment;Mother's request;Term;Breastfeeding assistance;Other (Comment) (COPD ( albuterol/dulera)) Age:40 years  LC assisted with latch getting depth on breast with audible swallows heard. Mom compression stripe previous latch, no other signs of nipple trauma.   Plan 1. To feed based on cues 8-12x 24 hr period. Mom to offer breasts and look for signs of milk transfer.  2. If unable to latch, Mom to offer EBM via spoon 5-7 ml  3. I and O sheet reviewed.  All questions answered at the end of the visit.   Maternal Data Has patient been taught Hand Expression?: Yes Does the patient have breastfeeding experience prior to this delivery?: Yes How long did the patient breastfeed?: 1 year 2 previous children currently 49 and 11 years old  Feeding Mother's Current Feeding Choice: Breast Milk  LATCH Score Latch: Repeated attempts needed to sustain latch, nipple held in mouth throughout feeding, stimulation needed to elicit sucking reflex.  Audible Swallowing: Spontaneous and intermittent  Type of Nipple: Everted at rest and after stimulation  Comfort (Breast/Nipple): Soft / non-tender  Hold (Positioning): Assistance needed to correctly position infant at breast and maintain latch.  LATCH Score: 8   Lactation Tools Discussed/Used    Interventions Interventions: Breast feeding basics reviewed;Support pillows;Education;Assisted with latch;Position options;Skin to skin;Expressed Clinical research associate;Infant Driven Feeding Algorithm education;Breast compression;Adjust position  Discharge [facility] Program: Yes  Consult Status Consult Status: Follow-up Date: 07/16/21 Follow-up type: In-patient    Mikyla Schachter  Nicholson-Springer 07/15/2021, 1:43 PM

## 2021-07-16 MED ORDER — IBUPROFEN 600 MG PO TABS
600.0000 mg | ORAL_TABLET | Freq: Four times a day (QID) | ORAL | 0 refills | Status: AC
Start: 2021-07-16 — End: ?

## 2021-07-16 NOTE — Clinical Social Work Maternal (Signed)
CLINICAL SOCIAL WORK MATERNAL/CHILD NOTE  Patient Details  Name: Darlene Stanley MRN: [id number] Date of Birth: 11-May-1981  Date:  07/16/2021  Clinical Social Worker Initiating Note:  Laurey Arrow Date/Time: Initiated:  07/16/21/1330     Child's Name:  Darlene Stanley   Biological Parents:  Mother (MOB declined to provide any information about FOB.)   Need for Interpreter:  None   Reason for Referral:  Behavioral Health Concerns, Current Substance Use/Substance Use During Pregnancy     Address:  [address] [place] [address]    Phone number:  [phone] (home)     Additional phone number:   Household Members/Support Persons (HM/SP):   Household Member/Support Person 1, Household Member/Support Person 2   HM/SP Name Relationship DOB or Age  HM/SP -1 Tristin Men son 12/01/1998  HM/SP -2 Aiden Rosana Hoes son 06/02/2004  HM/SP -3        HM/SP -4        HM/SP -5        HM/SP -6        HM/SP -7        HM/SP -8          Natural Supports (not living in the home):  Immediate Family, Parent, Friends   Chiropodist: None   Employment: Animator   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, maintenance (IT):  Nurse, adult   Homebound arranged:    Pensions consultant:  Multimedia programmer , [employer]   Other Resources:  [employer] ([place] provided MOB with information to apply for [employer].)   Cultural/Religious Considerations Which May Impact Care:  None reported  Strengths:  Ability to meet basic needs  , Home prepared for child  , Understanding of illness, Engineer, materials, Other  (Comment)   Psychotropic Medications:         Pediatrician:     ([place] Dept.)  Pediatrician List:   [place]      Pediatrician Fax Number:    Risk Factors/Current Problems:  Substance Use  , Mental Health Concerns     Cognitive State:  Alert  ,  Insightful  , Goal Oriented  , Linear Thinking     Mood/Affect:  Comfortable  , Calm  , Interested  , Happy  , Bright  , Relaxed     CSW Assessment: CSW received consult for history of anxiety, bipolar, and THC use.  CSW met with MOB in room 102 to offer support and complete assessment.   MOB was resting in bonding with infant when CSW arrived; everyone appeared comfortable and happy. CSW introduced self and explained reason for consult to which MOB expressed understanding. MOB very pleasant and easy to engage throughout assessment. MOB reported she currently lives with 2 older sons and works full-time. MOB confirmed she receives both [facility] and Medicaid.   CSW inquired about MOB's mental health history and MOB acknowledged a dx of anxiety and bipolar at age 71.  MOB reported her symptoms were managed by medication until about 5 years ago.  MOB stated that her current symptoms are know managed by natural interventions.  MOB reported she has a really good support system and feels comfortable seeking help if symptoms start to arise. MOB denied any symptoms during her pregnancy and denied any need or interest in medications or counseling. CSW provided education regarding  the baby blues period vs. perinatal mood disorders. CSW recommended self-evaluation during the postpartum time period using the New Mom Checklist from Postpartum Progress and encouraged MOB to contact a medical professional if symptoms are noted at any time. MOB did not appear to be displaying any acute mental health symptoms and denied any current SI, HI or DV. MOB confirmed having all essential items for infant once discharged and stated infant would be sleeping in a bassinet once home. CSW provided review of Sudden Infant Death Syndrome (SIDS) precautions and safe sleeping habits.    CSW inquired about MOB's substance use during pregnancy and MOB reported the use of CBDs throughout pregnancy.  Per MOB her last use of CBDs was 2 weeks ago. CSW  informed MOB of [place] and explained that infant's UDS is positive and CSW will make a report to [place]. MOB denied any questions or concerns regarding policy and denied CPS hx. CSW made a CPS report to [place] worker [employer].     CSW will continue to monitor infant's CDS and will provide results to CPS.  CSW Plan/Description:  No Further Intervention Required/No Barriers to Discharge, Sudden Infant Death Syndrome (SIDS) Education, Perinatal Mood and Anxiety Disorder (PMADs) Education, Other Patient/Family Education, [place], Other Information/Referral to [employer], Child Protective Service Report  , CSW Will Continue to Monitor Umbilical Cord Tissue Drug Screen Results and Make Report if Warranted   Laurey Arrow, MSW, LCSW Clinical Social Work [phone]   Dimple Nanas, LCSW 07/16/2021, 1:56 PM

## 2021-07-16 NOTE — Lactation Note (Signed)
This note was copied from a baby's chart. Lactation Consultation Note  Patient Name: Darlene Stanley LOVFI'E Date: 07/16/2021 Reason for consult: Follow-up assessment;Term Age:42 hours  LC in to visit with P3 Mom of term baby.  Baby at 4.7% weight loss with good output.  Bilirubin level stable.  Mom has been exclusively breastfeeding baby and denies any questions or problems.  Reviewed basics. Breasts feeling heavier today. Baby swaddled and sleeping following circumcision.   Encouraged STS with baby and offering the breast with cues, goal of 8-12 feedings per 24 hrs.   Engorgement prevention and treatment reviewed. Mom aware of OP lactation support available to her.  Encouraged to call prn.  Interventions Interventions: Breast feeding basics reviewed;Skin to skin;Breast massage;Hand express  Discharge Discharge Education: Engorgement and breast care;Warning signs for feeding baby  Consult Status Consult Status: Complete Date: 07/16/21 Follow-up type: Call as needed    Judee Clara 07/16/2021, 11:53 AM

## 2021-07-18 LAB — BPAM RBC
Blood Product Expiration Date: 202212032359
Blood Product Expiration Date: 202212102359
Blood Product Expiration Date: 202212152359
Blood Product Expiration Date: 202212152359
ISSUE DATE / TIME: 202211171733
Unit Type and Rh: 5100
Unit Type and Rh: 5100
Unit Type and Rh: 9500
Unit Type and Rh: 9500

## 2021-07-18 LAB — TYPE AND SCREEN
ABO/RH(D): O POS
Antibody Screen: POSITIVE
Donor AG Type: NEGATIVE
Donor AG Type: NEGATIVE
Unit division: 0
Unit division: 0
Unit division: 0
Unit division: 0

## 2021-07-26 ENCOUNTER — Telehealth (HOSPITAL_COMMUNITY): Payer: Self-pay | Admitting: *Deleted

## 2021-07-26 NOTE — Telephone Encounter (Signed)
Attempted hospital discharge follow-up call. Left message for patient to return RN call. Deforest Hoyles, RN, 07/26/21, [phone]

## 2021-08-17 ENCOUNTER — Other Ambulatory Visit: Payer: Self-pay | Admitting: Women's Health

## 2021-08-17 ENCOUNTER — Telehealth: Payer: Self-pay | Admitting: Clinical

## 2021-08-17 ENCOUNTER — Ambulatory Visit (INDEPENDENT_AMBULATORY_CARE_PROVIDER_SITE_OTHER): Payer: 59 | Admitting: Women's Health

## 2021-08-17 ENCOUNTER — Other Ambulatory Visit: Payer: Self-pay

## 2021-08-17 ENCOUNTER — Encounter: Payer: Self-pay | Admitting: Women's Health

## 2021-08-17 DIAGNOSIS — Z8632 Personal history of gestational diabetes: Secondary | ICD-10-CM | POA: Diagnosis not present

## 2021-08-17 DIAGNOSIS — F418 Other specified anxiety disorders: Secondary | ICD-10-CM

## 2021-08-17 MED ORDER — SLYND 4 MG PO TABS: 1.0000 | ORAL_TABLET | Freq: Every day | ORAL | 3 refills | Status: DC

## 2021-08-17 MED ORDER — NORETHINDRONE 0.35 MG PO TABS: 1.0000 | ORAL_TABLET | Freq: Every day | ORAL | 3 refills | Status: AC

## 2021-08-17 NOTE — Patient Instructions (Signed)
You will have your sugar test next visit.  Please do not eat or drink anything after midnight the night before you come, not even water.  You will be here for at least two hours.  Please make an appointment online for the bloodwork at [email or web address] for 8:30am (or as close to this as possible). Make sure you select the [address] service center. The day of the appointment, check in with our office first, then you will go to [facility] to start the sugar test.   

## 2021-08-17 NOTE — Progress Notes (Signed)
POSTPARTUM VISIT Patient name: Darlene Stanley MRN [id number]  Date of birth: 12/31/1980 Chief Complaint:   Postpartum Care  History of Present Illness:   Darlene Stanley is a 40 y.o. [phone] Caucasian female being seen today for a postpartum visit. She is 4 weeks postpartum following a vacuum, low at 40.1 gestational weeks. IOL: yes, for diabetes mellitus A1DM. Anesthesia: none.  Laceration: 2nd degree.  Complications: none. Inpatient contraception: no.   Pregnancy complicated by AMA, B0JG . Tobacco use: former . Substance use disorder: no. Last pap smear: 08/17/19 and results were negative per pt report at office in [facility] . Next pap smear due: 2023 Patient's last menstrual period was 08/16/2021 (exact date).  Postpartum course has been uncomplicated. Bleeding  on period now . Bowel function is normal. Bladder function is normal. Urinary incontinence? no, fecal incontinence? no Patient is not sexually active. Last sexual activity: prior to birth of baby. Desired contraception: POPs. Patient does not want a pregnancy in the future.  Desired family size is 3 children.   Upstream - 08/17/21 1154       Pregnancy Intention Screening   Does the patient want to become pregnant in the next year? No    Does the patient's partner want to become pregnant in the next year? No    Would the patient like to discuss contraceptive options today? Yes      Contraception Wrap Up   Current Method Abstinence    End Method Oral Contraceptive    Contraception Counseling Provided Yes            The pregnancy intention screening data noted above was reviewed. Potential methods of contraception were discussed. The patient elected to proceed with Oral Contraceptive.  Edinburgh Postpartum Depression Screening: positie, h/o bipolar, no meds. More anxiety right now. Does not feel she needs meds at this time. Is interested in therapy. Denies SI/HI/II. Has good support at home, sleeping and eating well. Unsure if she  still finds joy in things she used to, hasn't really done any of those things  Edinburgh Postnatal Depression Scale - 08/17/21 1155       Edinburgh Postnatal Depression Scale:  In the Past 7 Days   I have been able to laugh and see the funny side of things. 1    I have looked forward with enjoyment to things. 0    I have blamed myself unnecessarily when things went wrong. 2    I have been anxious or worried for no good reason. 3    I have felt scared or panicky for no good reason. 2    Things have been getting on top of me. 2    I have been so unhappy that I have had difficulty sleeping. 1    I have felt sad or miserable. 1    I have been so unhappy that I have been crying. 1    The thought of harming myself has occurred to me. 0    Edinburgh Postnatal Depression Scale Total 13             GAD 7 : Generalized Anxiety Score 04/13/2021 01/18/2021 12/02/2020  Nervous, Anxious, on Edge '2 3 2  ' Control/stop worrying '2 2 1  ' Worry too much - different things '2 2 1  ' Trouble relaxing '2 1 1  ' Restless 2 1 0  Easily annoyed or irritable 0 2 1  Afraid - awful might happen 0 1 0  Total GAD 7 Score 10 12 6  Baby's course has been uncomplicated. Baby is feeding by breast: milk supply adequate. Infant has a pediatrician/family doctor? Yes.  Childcare strategy if returning to work/school: n/a-working from home.  Pt has material needs met for her and baby: Yes.   Review of Systems:   Pertinent items are noted in HPI Denies Abnormal vaginal discharge w/ itching/odor/irritation, headaches, visual changes, shortness of breath, chest pain, abdominal pain, severe nausea/vomiting, or problems with urination or bowel movements. Pertinent History Reviewed:  Reviewed past medical,surgical, obstetrical and family history.  Reviewed problem list, medications and allergies. OB History  Gravida Para Term Preterm AB Living  '5 3 3   2 3  ' SAB IAB Ectopic Multiple Live Births  2     0 3    # Outcome Date GA  Lbr Len/2nd Weight Sex Delivery Anes PTL Lv  5 Term 07/15/21 29w1d00:15 / 00:11 7 lb 8.6 oz (3.42 kg) M Vag-Vacuum Local  LIV  4 Term 06/02/04 329w0d9 lb 9 oz (4.338 kg) M Vag-Spont EPI N LIV  3 Term 12/01/98 3996w0d lb 11 oz (3.487 kg) M Vag-Vacuum  Y LIV     Birth Comments: "fluid low"  2 SAB           1 SAB            Physical Assessment:   Vitals:   08/17/21 1152  BP: 122/79  Pulse: 80  Weight: 220 lb (99.8 kg)  Height: '5\' 5"'  (1.651 m)  Body mass index is 36.61 kg/m.       Physical Examination:   General appearance: alert, well appearing, and in no distress  Mental status: alert, oriented to person, place, and time  Skin: warm & dry   Cardiovascular: normal heart rate noted   Respiratory: normal respiratory effort, no distress   Breasts: deferred, no complaints   Abdomen: soft, non-tender   Pelvic: lac well healed. Thin prep pap obtained: No  Rectal: not examined  Extremities: Edema: none   Chaperone: Angel Neas         No results found for this or any previous visit (from the past 24 hour(s)).  Assessment & Plan:  1) Postpartum exam 2) 4 wks s/p vacuum, low 3) breast feeding 4) Depression screening 5) Contraception management: rx Slynd, if any issues w/ insurance coverage let us [place] and will switch to micronor 6) Dep/anx, h/o bipolar> no meds, interested in therapy, [facility] referral ordered. Let us [place] if decides for meds  Essential components of care per ACOG recommendations:  1.  Mood and well being:  If positive depression screen, discussed and plan developed.  If using tobacco we discussed reduction/cessation and risk of relapse If current substance abuse, we discussed and referral to local resources was offered.   2. Infant care and feeding:  If breastfeeding, discussed returning to work, pumping, breastfeeding-associated pain, guidance regarding return to fertility while lactating if not using another method. If needed, patient was provided with a letter  to be allowed to pump q 2-3hrs to support lactation in a private location with access to a refrigerator to store breastmilk.   Recommended that all caregivers be immunized for flu, pertussis and other preventable communicable diseases If pt does not have material needs met for her/baby, referred to local resources for help obtaining these.  3. Sexuality, contraception and birth spacing Provided guidance regarding sexuality, management of dyspareunia, and resumption of intercourse Discussed avoiding interpregnancy interval <6mt18mand recommended birth spacing of 1891  months  4. Sleep and fatigue Discussed coping options for fatigue and sleep disruption Encouraged family/partner/community support of 4 hrs of uninterrupted sleep to help with mood and fatigue  5. Physical recovery  If pt had a C/S, assessed incisional pain and providing guidance on normal vs prolonged recovery If pt had a laceration, perineal healing and pain reviewed.  If urinary or fecal incontinence, discussed management and referred to PT or uro/gyn if indicated  Patient is safe to resume physical activity. Discussed attainment of healthy weight.  6.  Chronic disease management Discussed pregnancy complications if any, and their implications for future childbearing and long-term maternal health. Review recommendations for prevention of recurrent pregnancy complications, such as 17 hydroxyprogesterone caproate to reduce risk for recurrent PTB not applicable, or aspirin to reduce risk of preeclampsia not applicable. Pt had GDM: yes. If yes, 2hr GTT scheduled: yes. Reviewed medications and non-pregnant dosing including consideration of whether pt is breastfeeding using a reliable resource such as LactMed: yes Referred for f/u w/ PCP or subspecialist providers as indicated: not applicable  7. Health maintenance Mammogram at 40yo or earlier if indicated Pap smears as indicated  Meds:  Meds ordered this encounter  Medications    Drospirenone (SLYND) 4 MG TABS    Sig: Take 1 tablet by mouth daily.    Dispense:  90 tablet    Refill:  3    Order Specific Question:   Supervising Provider    Answer:   Florian Buff [[id number]]    Follow-up: Return in about 4 weeks (around 09/14/2021) for 2hr sugar test; then 31yrfor pap & physical.   Orders Placed This Encounter  Procedures   [facility]28 PM

## 2021-08-17 NOTE — Telephone Encounter (Signed)
Attempt to schedule, per referral; Left HIPPA-compliant message to call back Harlo Jaso from [facility] at  [phone] (Dj Senteno's office); left MyChart message for pt. °

## 2021-08-22 ENCOUNTER — Other Ambulatory Visit: Payer: Self-pay | Admitting: Women's Health

## 2021-08-22 MED ORDER — ESCITALOPRAM OXALATE 10 MG PO TABS: 10.0000 mg | ORAL_TABLET | Freq: Every day | ORAL | 6 refills | Status: DC

## 2021-08-28 ENCOUNTER — Encounter: Payer: Self-pay | Admitting: Women's Health

## 2021-09-14 ENCOUNTER — Other Ambulatory Visit: Payer: 59

## 2021-09-14 DIAGNOSIS — Z8632 Personal history of gestational diabetes: Secondary | ICD-10-CM

## 2021-10-23 ENCOUNTER — Other Ambulatory Visit: Payer: Self-pay | Admitting: Women's Health

## 2022-05-29 IMAGING — US US OB < 14 WEEKS - US OB TV
1 series · 13 of 28 positions shown · non-contrast
Comparison: None.

CLINICAL DATA: Heavy bleeding, last menstrual period 07/29/2020,
gestational age by last menstrual period of 6 weeks and 1 day with
estimated due date of 05/05/2021. Quantitative beta hCG pending.

EXAM:
OBSTETRIC <14 WK US AND TRANSVAGINAL OB US
TECHNIQUE: Both transabdominal and transvaginal ultrasound examinations were
performed for complete evaluation of the gestation as well as the
maternal uterus, adnexal regions, and pelvic cul-de-sac.
Transvaginal technique was performed to assess early pregnancy.

[Series 1: us ob less than 14 weeks with ob transvaginal · 13 of 56 slices shown]
[im 3/56]
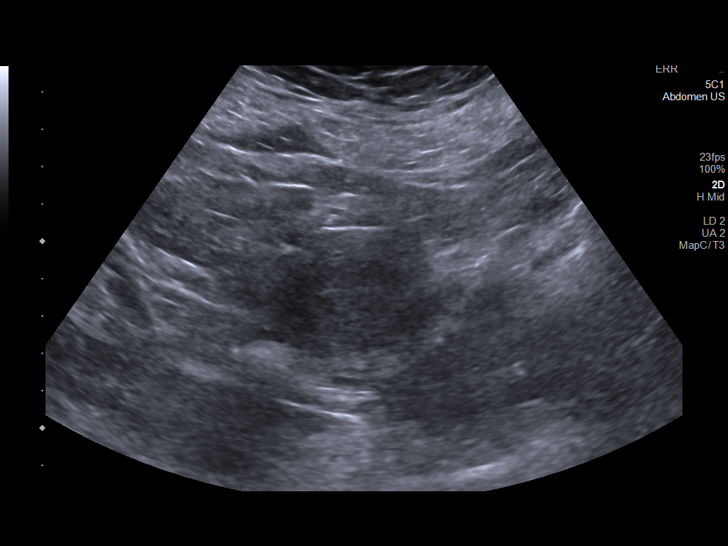
[im 7/56]
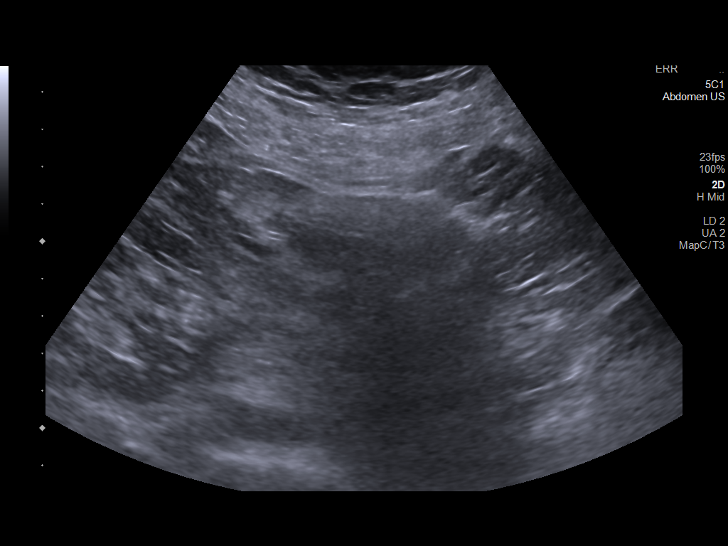
[im 11/56]
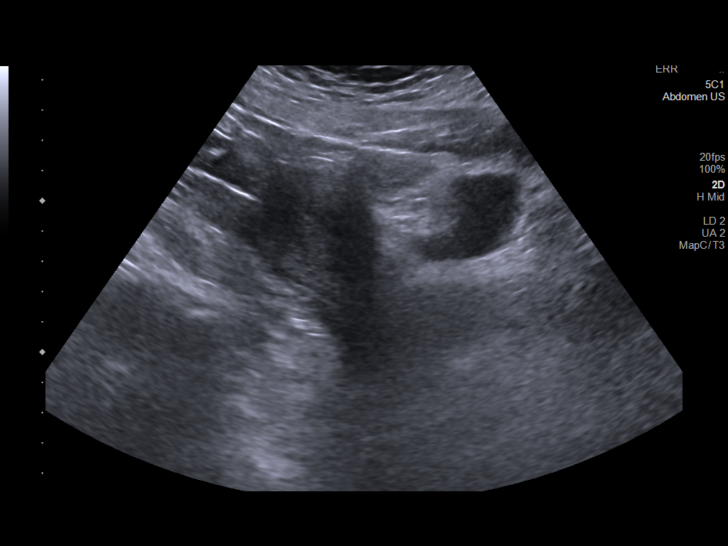
[im 15/56]
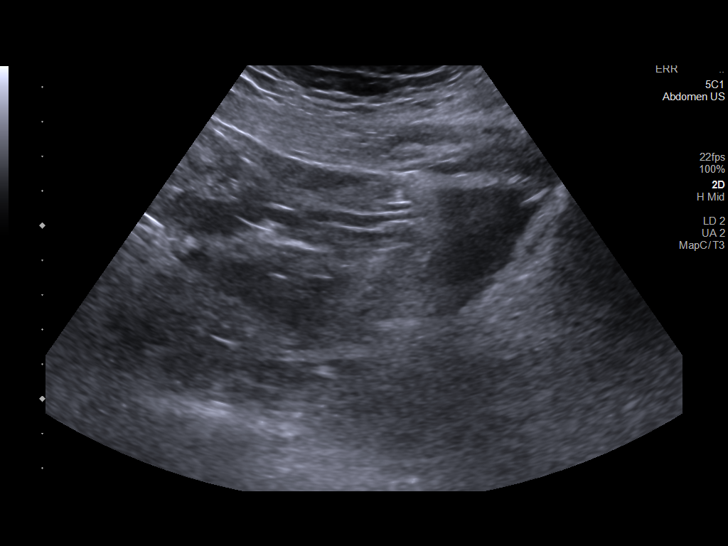
[im 19/56]
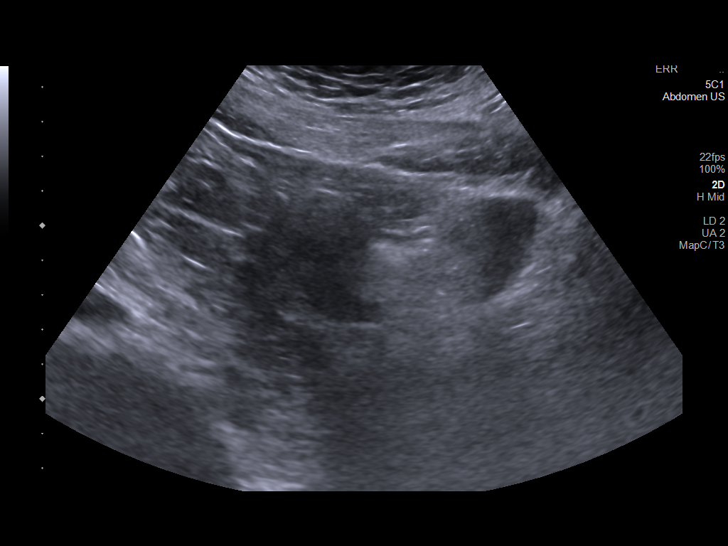
[im 23/56]
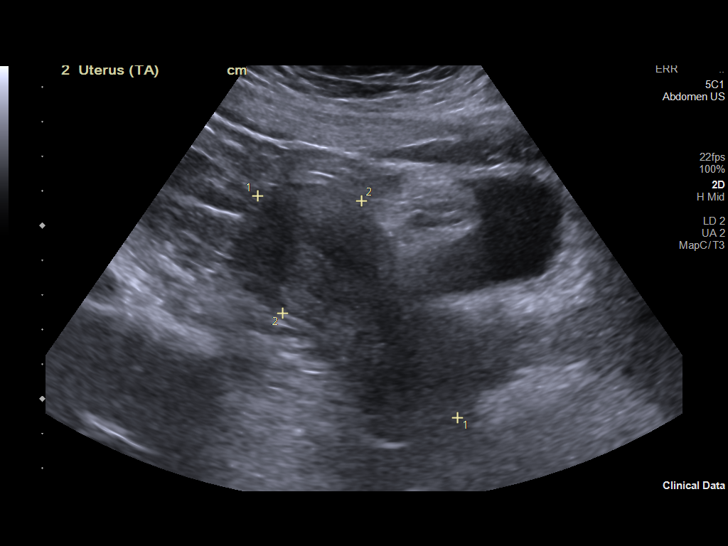
[im 29/56]
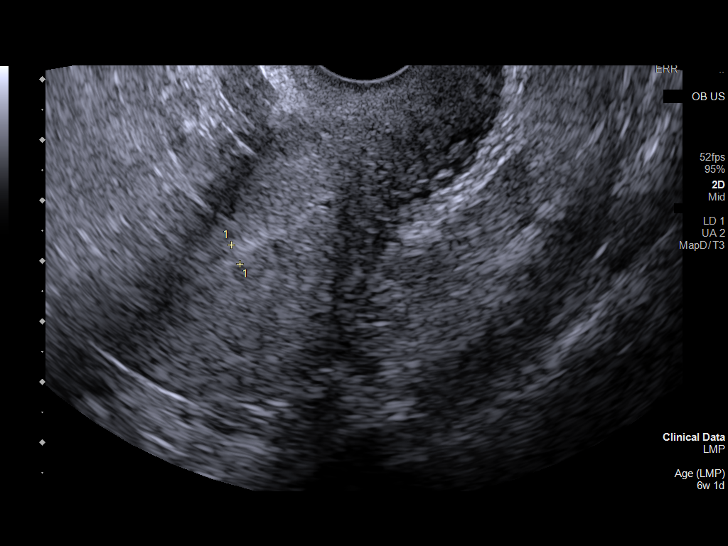
[im 33/56]
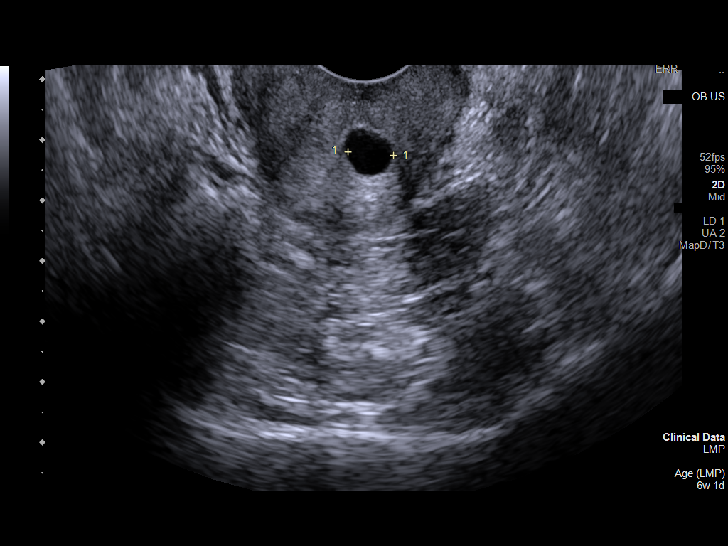
[im 37/56]
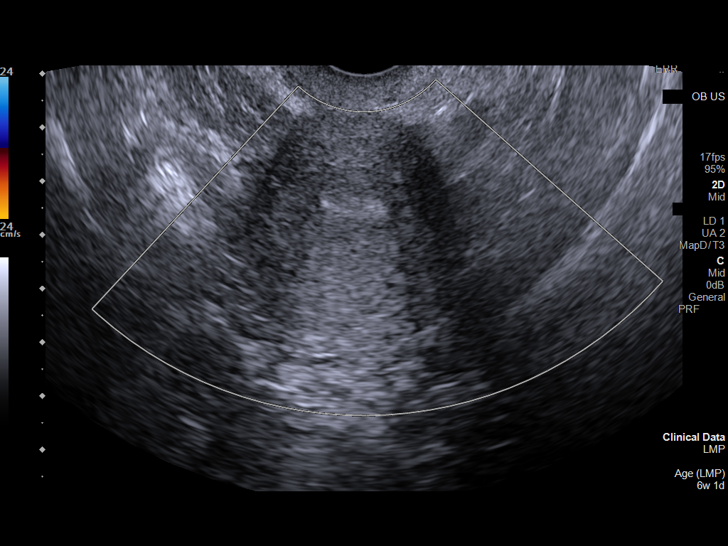
[im 41/56]
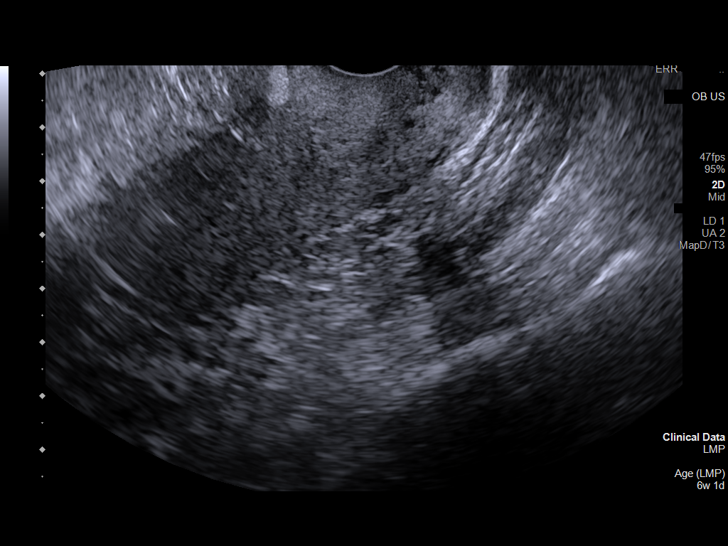
[im 45/56]
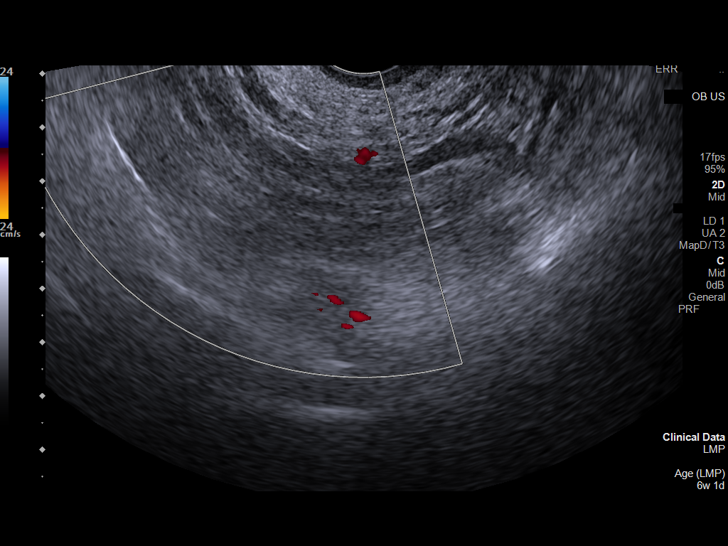
[im 49/56]
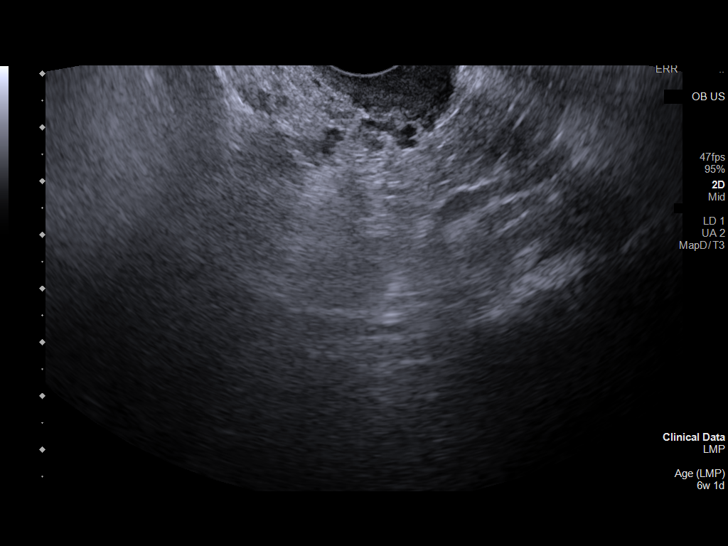
[im 53/56]
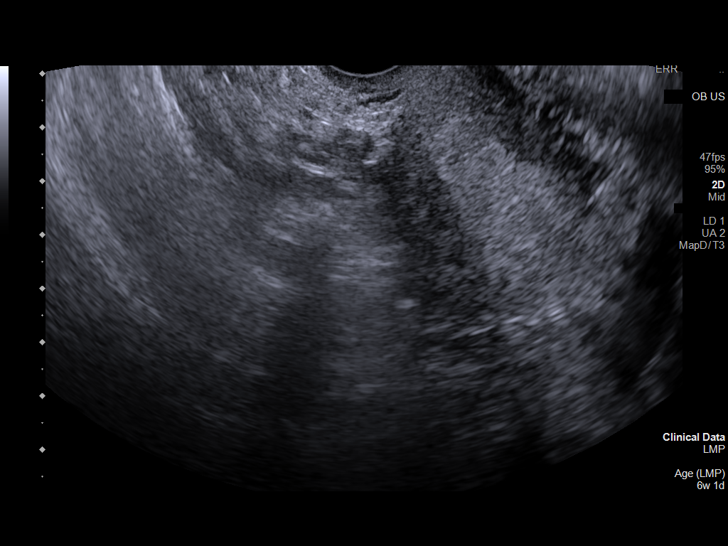

[13 of 28 positions shown; findings below may reference images not displayed]

FINDINGS: Intrauterine gestational sac: None

Yolk sac:  Not Visualized.

Embryo:  Not Visualized.

Cardiac Activity: Not Visualized.

Subchorionic hemorrhage:  None visualized.

Maternal uterus/adnexae: Bilateral ovaries are not visualized. The
uterus is grossly unremarkable.
IMPRESSION: 1. No gestational sac, yolk sac, fetal pole, or cardiac activity
visualized. An early pregnancy or ectopic pregnancy cannot be
excluded. Recommend follow-up quantitative B-HCG levels and
follow-up US in 14 days to assess viability. This recommendation
follows SRU consensus guidelines: Diagnostic Criteria for Nonviable
Pregnancy Early in the First Trimester. N Engl J Med 9804;
2. Bilateral ovaries are not visualized.

## 2022-08-20 ENCOUNTER — Other Ambulatory Visit: Payer: Self-pay | Admitting: Women's Health

## 2022-09-11 IMAGING — US US OB COMP LESS 14 WK
1 series · 14 of 28 positions shown · non-contrast
Comparison: None.

CLINICAL DATA: Vaginal bleeding, right lower quadrant pain

EXAM:
OBSTETRIC <14 WK ULTRASOUND
TECHNIQUE: Transabdominal ultrasound was performed for evaluation of the
gestation as well as the maternal uterus and adnexal regions.

[Series 1: us ob less than 14 weeks with ob transvaginal · 14 of 50 slices shown]
[im 2/50]
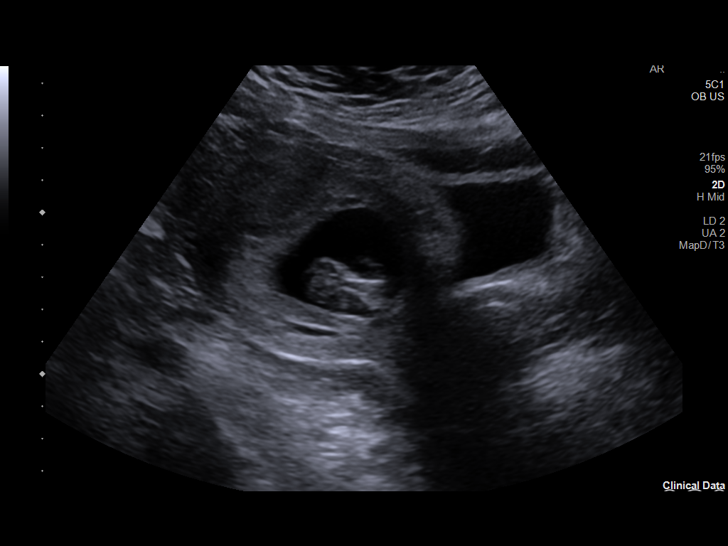
[im 6/50]
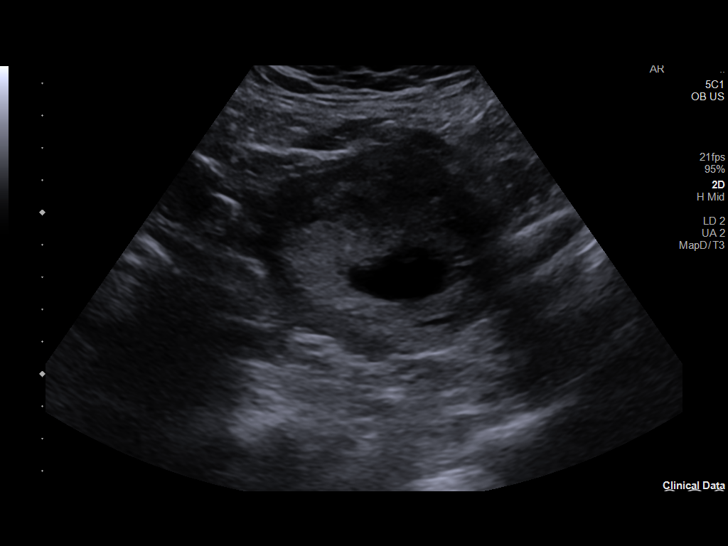
[im 10/50]
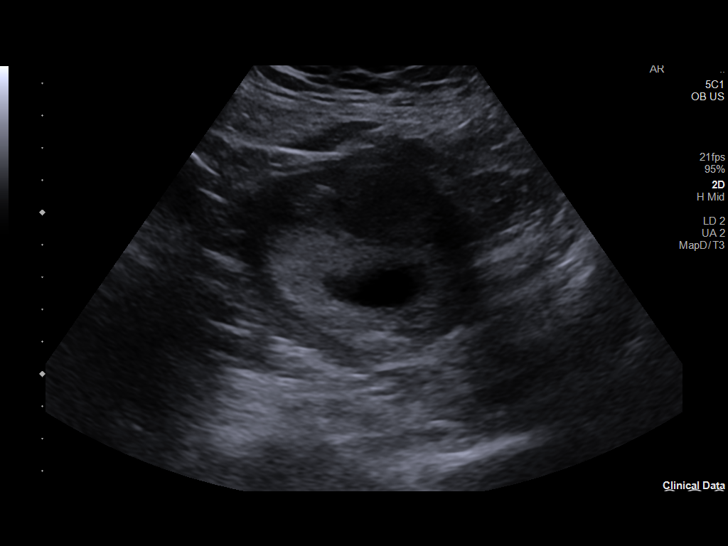
[im 13/50]
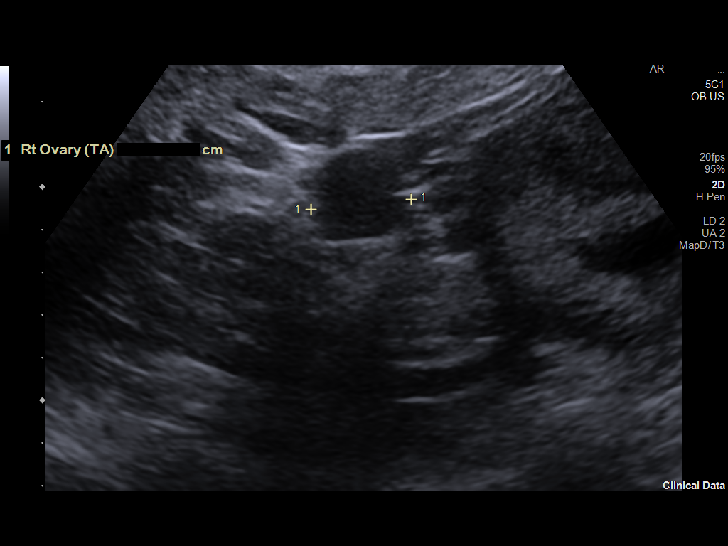
[im 17/50]
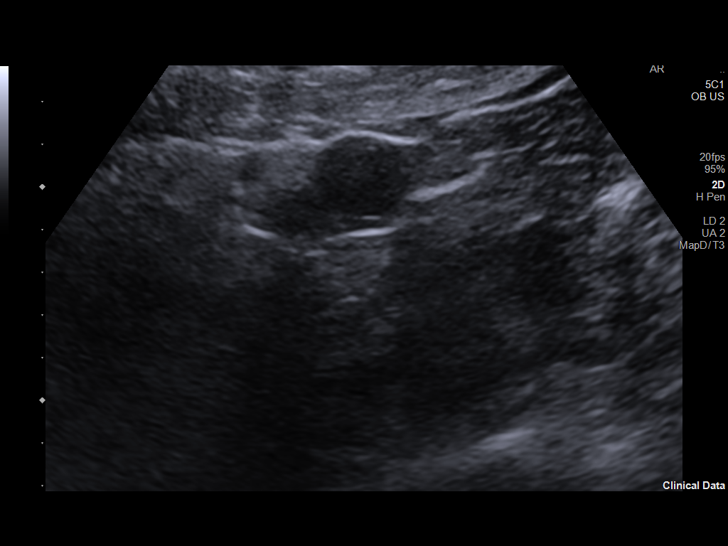
[im 20/50]
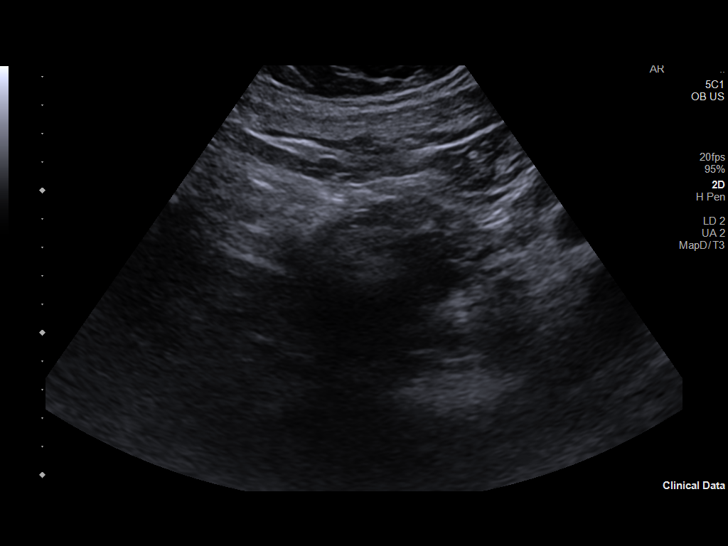
[im 24/50]
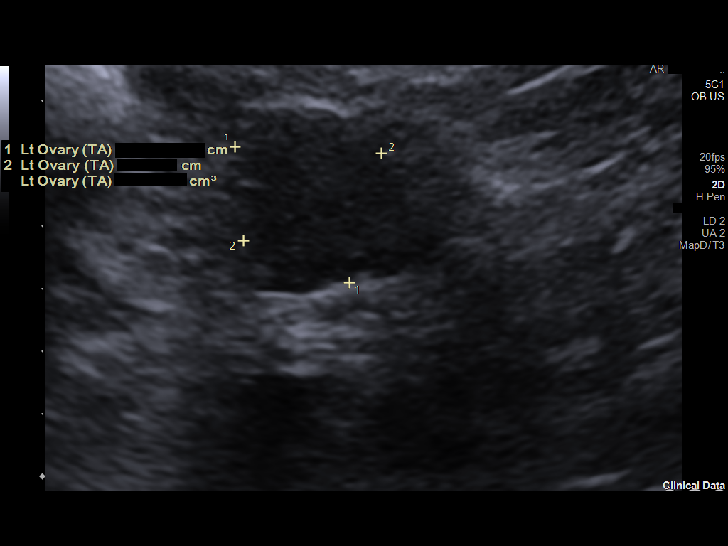
[im 28/50]
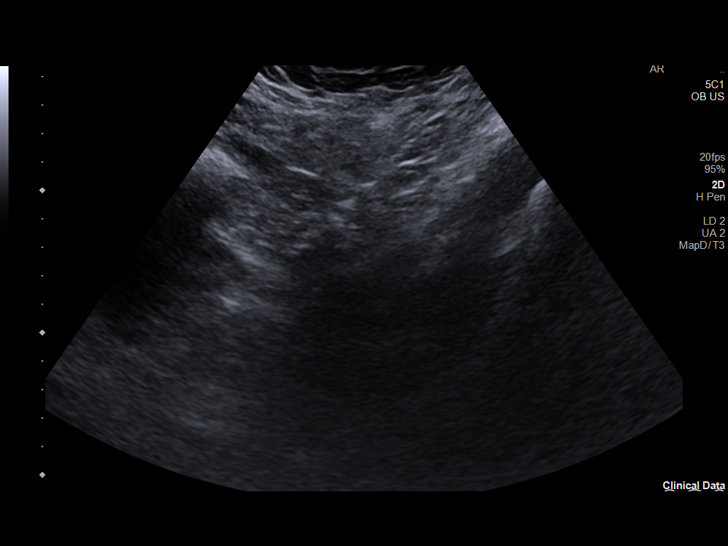
[im 31/50]
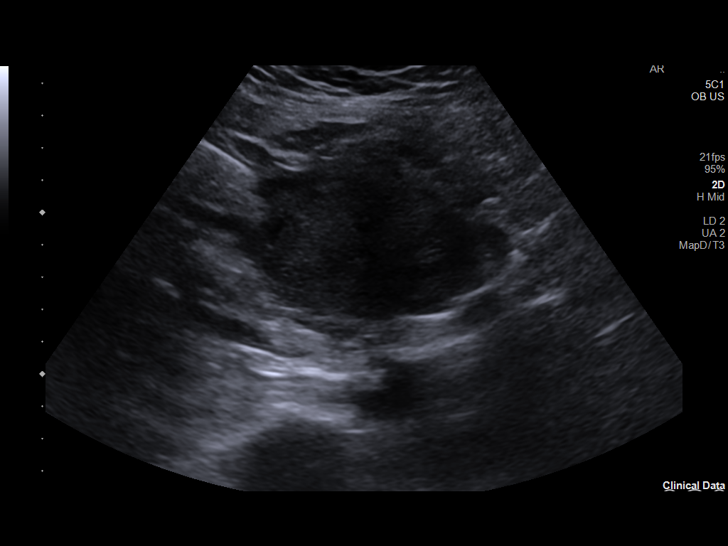
[im 35/50]
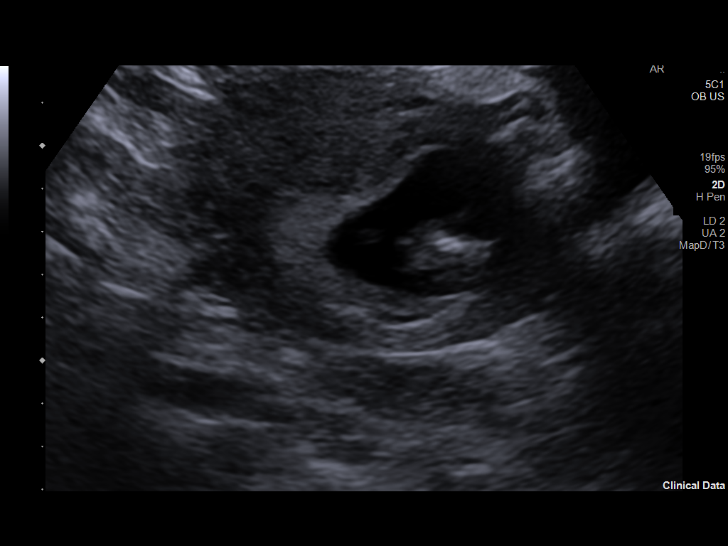
[im 39/50]
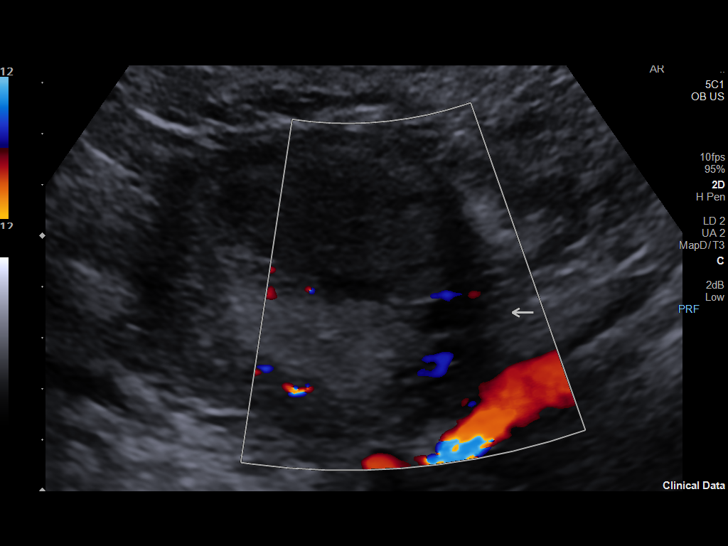
[im 42/50]
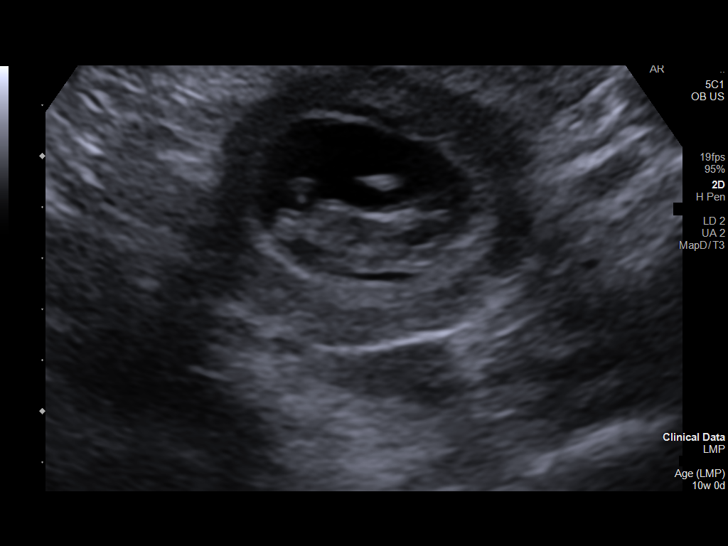
[im 46/50]
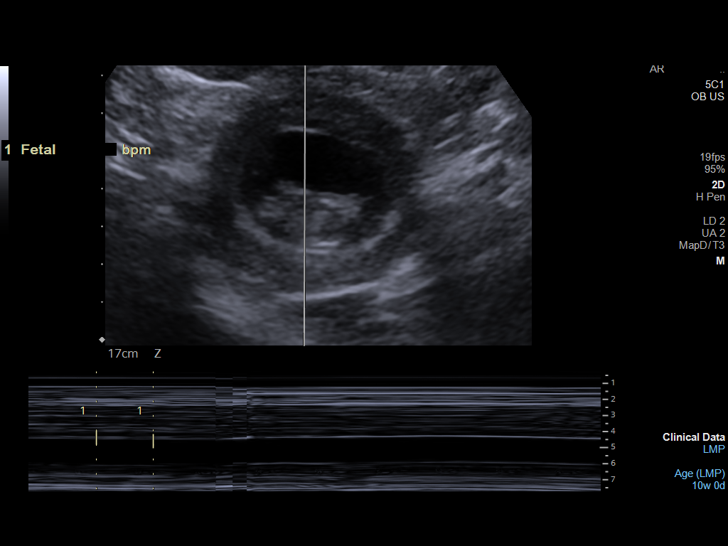
[im 50/50]
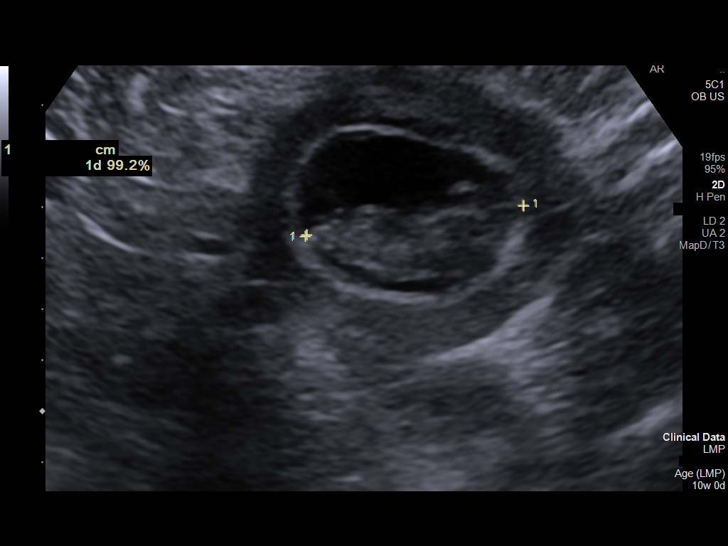

[14 of 28 positions shown; findings below may reference images not displayed]

FINDINGS: Intrauterine gestational sac: Single

Yolk sac:  Not Visualized.

Embryo:  Visualized.

Cardiac Activity: Visualized.

Heart Rate: 180 bpm

CRL: 42.9 mm   11 w 1 d                  US EDC: 07/14/2021

Subchorionic hemorrhage: There is trace subchorionic hemorrhage
along the superior margin of the gestational sac.

Maternal uterus/adnexae: Left ovary measures 2.7 x 2.8 x 2.6 cm and
the right ovary measures 3.2 x 2.3 x 2.4 cm. No free fluid.
IMPRESSION: 1. Single live intrauterine pregnancy as above, estimated age 11
weeks and 1 day.
2. Trace subchorionic hemorrhage.

## 2022-12-21 ENCOUNTER — Other Ambulatory Visit (HOSPITAL_COMMUNITY): Payer: Self-pay | Admitting: Family Medicine

## 2022-12-21 DIAGNOSIS — Z1231 Encounter for screening mammogram for malignant neoplasm of breast: Secondary | ICD-10-CM

## 2023-01-17 ENCOUNTER — Ambulatory Visit: Payer: Medicaid Other | Admitting: Nutrition

## 2023-03-12 ENCOUNTER — Ambulatory Visit: Payer: Medicaid Other | Admitting: Nutrition

## 2023-12-04 ENCOUNTER — Other Ambulatory Visit (HOSPITAL_COMMUNITY): Payer: Self-pay | Admitting: Family Medicine

## 2023-12-04 DIAGNOSIS — Z1231 Encounter for screening mammogram for malignant neoplasm of breast: Secondary | ICD-10-CM
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
## 2024-02-04 ENCOUNTER — Ambulatory Visit (HOSPITAL_COMMUNITY): Attending: Family Medicine

## 2024-02-04 ENCOUNTER — Other Ambulatory Visit: Payer: Self-pay

## 2024-02-04 DIAGNOSIS — M545 Low back pain, unspecified: Secondary | ICD-10-CM | POA: Insufficient documentation

## 2024-02-04 DIAGNOSIS — R262 Difficulty in walking, not elsewhere classified: Secondary | ICD-10-CM | POA: Diagnosis present

## 2024-02-17 ENCOUNTER — Ambulatory Visit (HOSPITAL_COMMUNITY)

## 2024-02-17 ENCOUNTER — Encounter (HOSPITAL_COMMUNITY): Payer: Self-pay

## 2024-02-18 ENCOUNTER — Encounter (HOSPITAL_COMMUNITY)

## 2024-02-25 ENCOUNTER — Encounter (HOSPITAL_COMMUNITY)

## 2024-03-03 ENCOUNTER — Encounter (HOSPITAL_COMMUNITY)

## 2024-03-10 ENCOUNTER — Encounter (HOSPITAL_COMMUNITY)

## 2024-03-17 ENCOUNTER — Encounter (HOSPITAL_COMMUNITY): Admitting: Physical Therapy

## 2024-03-24 ENCOUNTER — Encounter (HOSPITAL_COMMUNITY)

## 2024-04-08 ENCOUNTER — Other Ambulatory Visit: Payer: Self-pay | Admitting: Medical Genetics

## 2024-06-26 ENCOUNTER — Other Ambulatory Visit: Payer: Self-pay | Admitting: Medical Genetics

## 2024-06-26 DIAGNOSIS — Z006 Encounter for examination for normal comparison and control in clinical research program: Secondary | ICD-10-CM
